# Patient Record
Sex: Female | Born: 1935 | Race: White | Hispanic: No | Marital: Married | Smoking: Never smoker
Health system: Southern US, Community
[De-identification: ages and names within clinical notes are randomized; demographics above are authoritative.]

## PROBLEM LIST (undated history)

## (undated) DIAGNOSIS — K5730 Diverticulosis of large intestine without perforation or abscess without bleeding: Secondary | ICD-10-CM

## (undated) DIAGNOSIS — T8859XA Other complications of anesthesia, initial encounter: Secondary | ICD-10-CM

## (undated) DIAGNOSIS — G4730 Sleep apnea, unspecified: Secondary | ICD-10-CM

## (undated) DIAGNOSIS — M1990 Unspecified osteoarthritis, unspecified site: Secondary | ICD-10-CM

## (undated) DIAGNOSIS — I4891 Unspecified atrial fibrillation: Secondary | ICD-10-CM

## (undated) DIAGNOSIS — K5792 Diverticulitis of intestine, part unspecified, without perforation or abscess without bleeding: Secondary | ICD-10-CM

## (undated) DIAGNOSIS — R7303 Prediabetes: Secondary | ICD-10-CM

## (undated) DIAGNOSIS — Z972 Presence of dental prosthetic device (complete) (partial): Secondary | ICD-10-CM

## (undated) DIAGNOSIS — J45909 Unspecified asthma, uncomplicated: Secondary | ICD-10-CM

## (undated) DIAGNOSIS — R42 Dizziness and giddiness: Secondary | ICD-10-CM

## (undated) DIAGNOSIS — I639 Cerebral infarction, unspecified: Secondary | ICD-10-CM

## (undated) DIAGNOSIS — T4145XA Adverse effect of unspecified anesthetic, initial encounter: Secondary | ICD-10-CM

## (undated) DIAGNOSIS — I10 Essential (primary) hypertension: Secondary | ICD-10-CM

## (undated) DIAGNOSIS — K219 Gastro-esophageal reflux disease without esophagitis: Secondary | ICD-10-CM

## (undated) HISTORY — DX: Essential (primary) hypertension: I10

## (undated) HISTORY — DX: Gastro-esophageal reflux disease without esophagitis: K21.9

## (undated) HISTORY — DX: Sleep apnea, unspecified: G47.30

## (undated) HISTORY — DX: Diverticulosis of large intestine without perforation or abscess without bleeding: K57.30

## (undated) HISTORY — DX: Diverticulitis of intestine, part unspecified, without perforation or abscess without bleeding: K57.92

## (undated) HISTORY — DX: Cerebral infarction, unspecified: I63.9

---

## 1978-11-05 HISTORY — PX: LAPAROSCOPIC SALPINGOOPHERECTOMY: SUR795

## 1978-11-05 HISTORY — PX: ABDOMINAL HYSTERECTOMY: SHX81

## 1996-11-05 HISTORY — PX: BLADDER SURGERY: SHX569

## 2004-08-23 ENCOUNTER — Ambulatory Visit: Payer: Self-pay | Admitting: Unknown Physician Specialty

## 2004-11-05 HISTORY — PX: COLON SURGERY: SHX602

## 2005-10-05 ENCOUNTER — Ambulatory Visit: Payer: Self-pay | Admitting: General Surgery

## 2005-10-16 ENCOUNTER — Ambulatory Visit: Payer: Self-pay | Admitting: General Surgery

## 2005-10-17 ENCOUNTER — Ambulatory Visit: Payer: Self-pay | Admitting: General Surgery

## 2005-10-31 ENCOUNTER — Other Ambulatory Visit: Payer: Self-pay

## 2005-10-31 ENCOUNTER — Ambulatory Visit: Payer: Self-pay | Admitting: General Surgery

## 2005-11-07 ENCOUNTER — Inpatient Hospital Stay: Payer: Self-pay | Admitting: General Surgery

## 2006-06-27 ENCOUNTER — Ambulatory Visit: Payer: Self-pay | Admitting: Internal Medicine

## 2007-06-01 IMAGING — CR DG ABDOMEN 2V
1 series · 3 of 3 positions shown · non-contrast
Comparison: none

REASON FOR EXAM: Abdominal pain and cramping
COMMENTS:

[Series 1: view not recorded · 0.17mm/px · 3 of 3 slices shown]
[im 1/3]
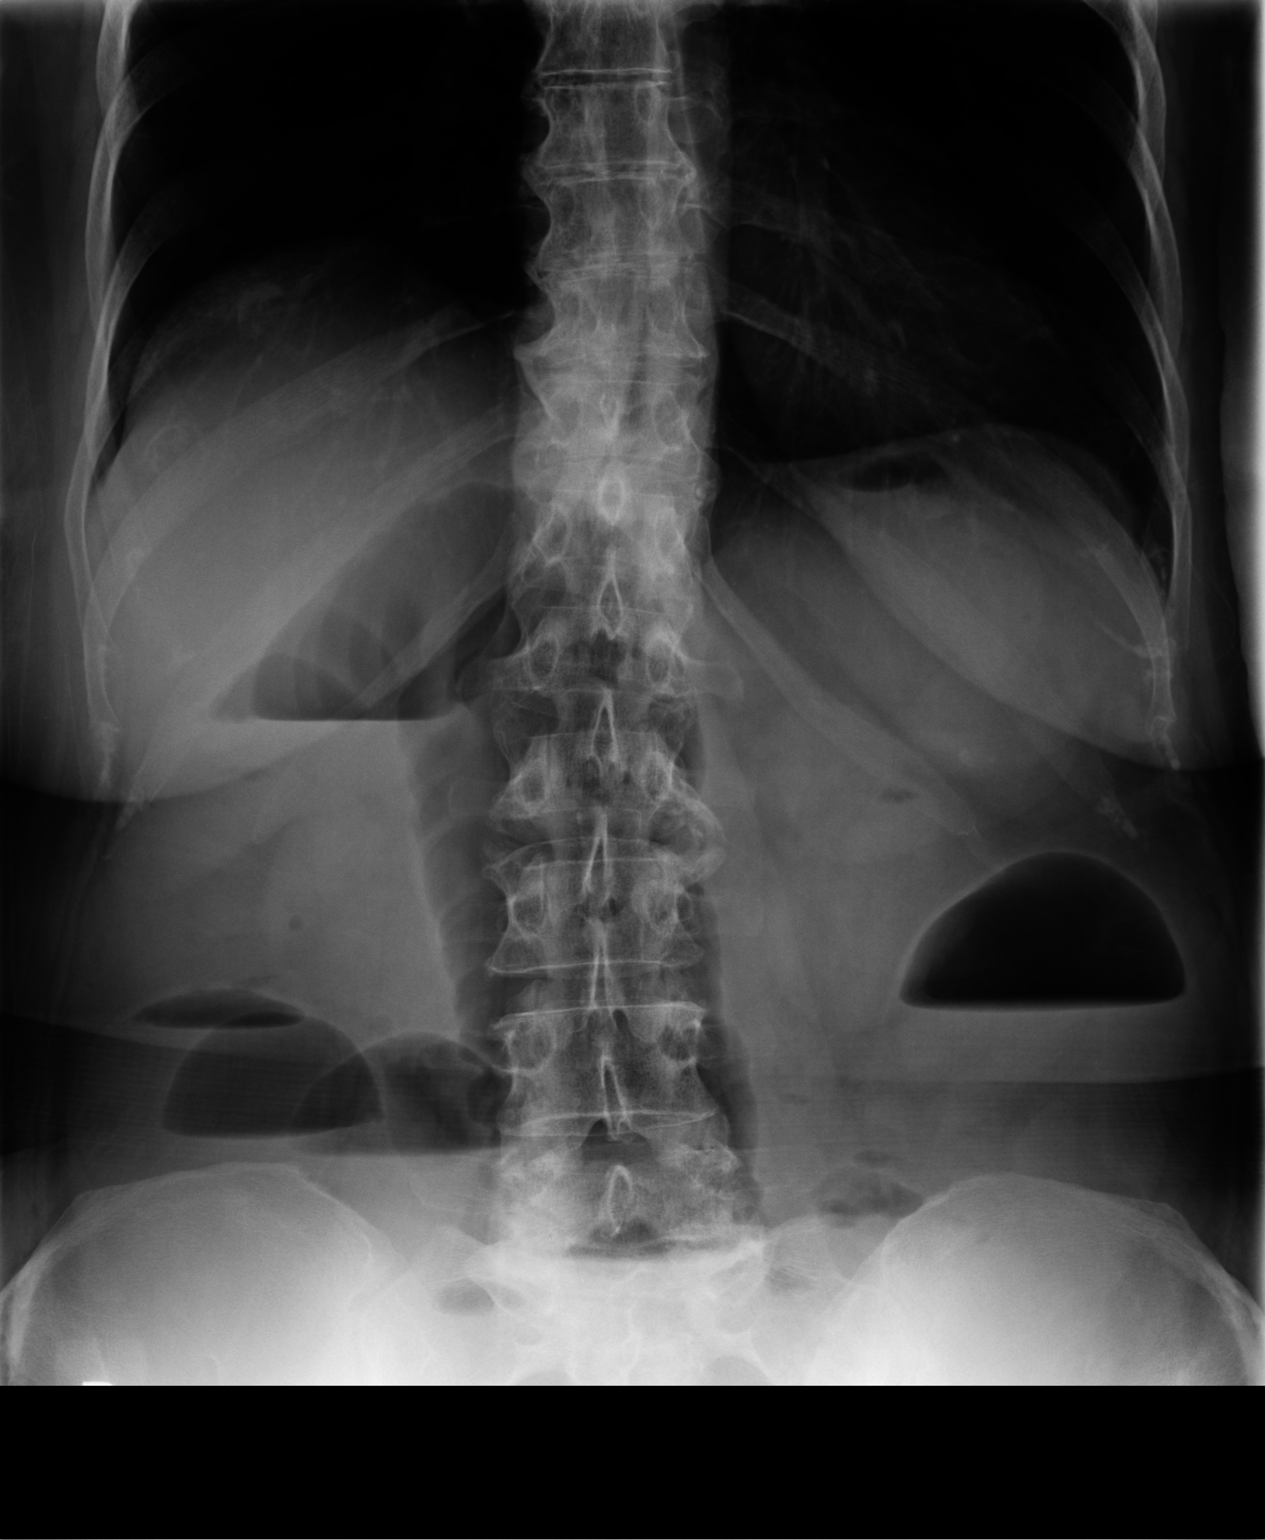
[im 2/3]
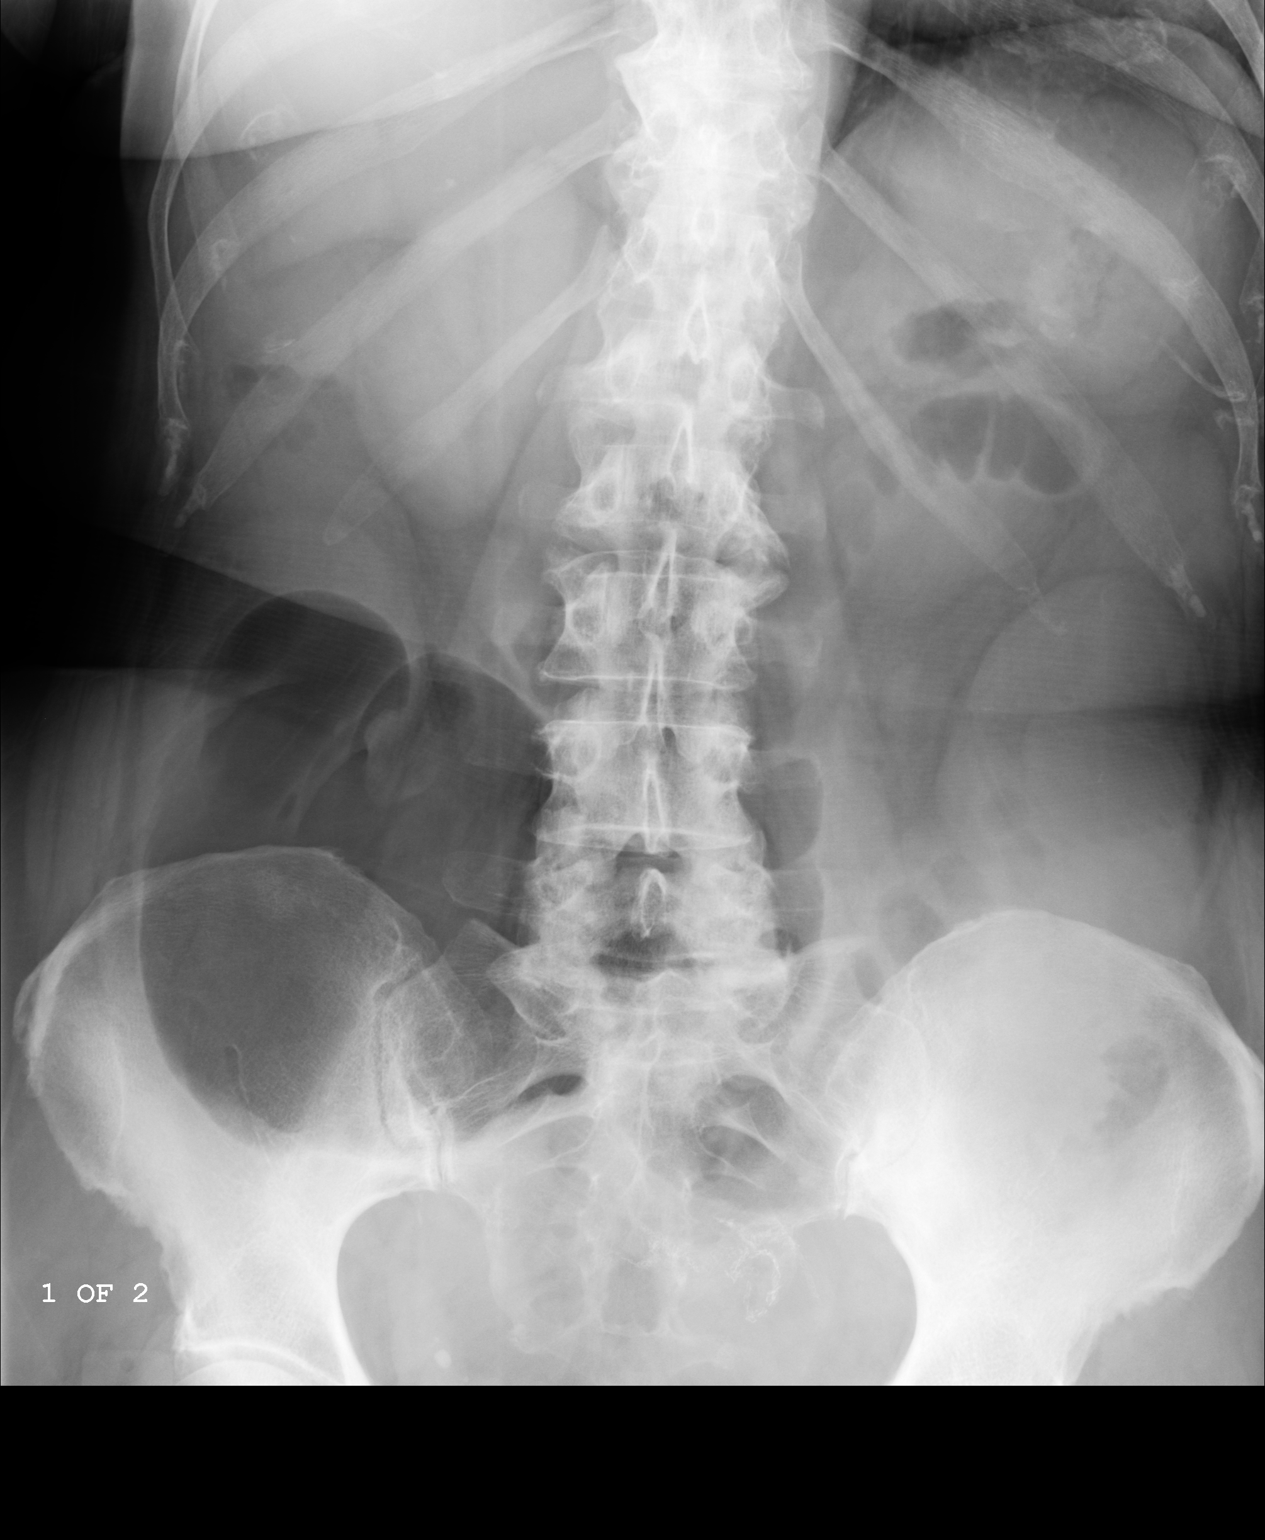
[im 3/3]
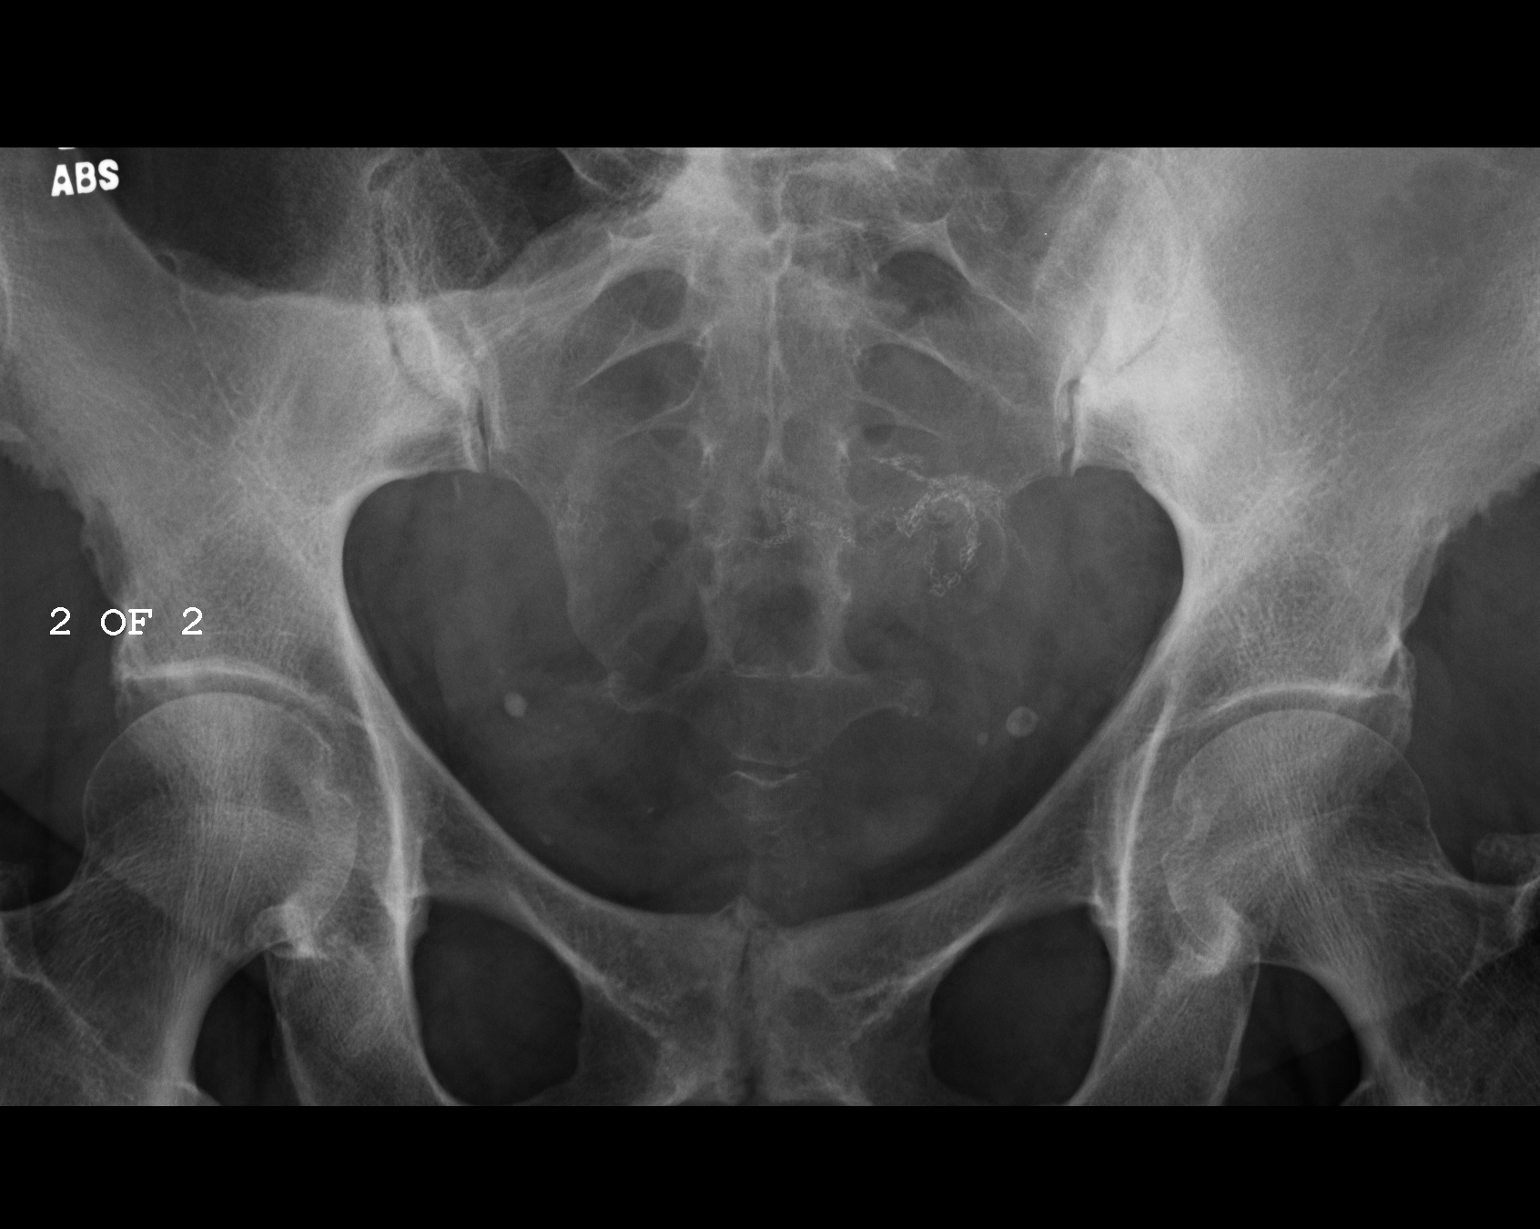

[3 of 3 positions shown; findings below may reference images not displayed]

PROCEDURE:     DXR - DXR ABDOMEN 2 V FLAT AND ERECT  - October 16, 2005  [DATE]

RESULT:     Flat and erect views of the abdomen show postoperative metallic
clips in the pelvis.  There are noted dilated loops of bowel containing
air-fluid levels.  The appearance is suspicious for bowel obstruction.
There is noted a pocket of gas in the RIGHT lower quadrant, which may
represent gas in the cecum but otherwise the colon appears to be relatively
airless.  Further evaluation by CT might be helpful.
IMPRESSION: Possible small bowel obstruction.

Cecal volvulus is considered unlikely but is also a consideration in the
differential.

Suggest CT for further evaluation.

## 2007-07-10 ENCOUNTER — Ambulatory Visit: Payer: Self-pay | Admitting: Internal Medicine

## 2007-11-15 ENCOUNTER — Ambulatory Visit: Payer: Self-pay | Admitting: Internal Medicine

## 2008-11-04 ENCOUNTER — Ambulatory Visit: Payer: Self-pay | Admitting: General Surgery

## 2008-11-05 HISTORY — PX: HERNIA REPAIR: SHX51

## 2008-11-18 ENCOUNTER — Inpatient Hospital Stay: Payer: Self-pay | Admitting: General Surgery

## 2009-05-02 ENCOUNTER — Ambulatory Visit: Payer: Self-pay | Admitting: Internal Medicine

## 2009-07-19 ENCOUNTER — Inpatient Hospital Stay: Payer: Self-pay | Admitting: Internal Medicine

## 2009-08-01 ENCOUNTER — Emergency Department: Payer: Self-pay | Admitting: Emergency Medicine

## 2009-11-05 ENCOUNTER — Ambulatory Visit: Payer: Self-pay

## 2009-11-05 HISTORY — PX: COLONOSCOPY: SHX174

## 2009-11-11 ENCOUNTER — Ambulatory Visit: Payer: Self-pay

## 2009-12-06 ENCOUNTER — Ambulatory Visit: Payer: Self-pay

## 2009-12-12 ENCOUNTER — Ambulatory Visit: Payer: Self-pay

## 2009-12-16 ENCOUNTER — Ambulatory Visit: Payer: Self-pay

## 2010-01-03 ENCOUNTER — Ambulatory Visit: Payer: Self-pay

## 2010-06-26 ENCOUNTER — Ambulatory Visit: Payer: Self-pay | Admitting: Internal Medicine

## 2010-09-19 ENCOUNTER — Ambulatory Visit: Payer: Self-pay | Admitting: General Surgery

## 2010-12-15 ENCOUNTER — Ambulatory Visit: Payer: Self-pay

## 2010-12-21 ENCOUNTER — Ambulatory Visit: Payer: Self-pay | Admitting: Internal Medicine

## 2011-01-04 ENCOUNTER — Ambulatory Visit: Payer: Self-pay | Admitting: Internal Medicine

## 2011-11-21 ENCOUNTER — Inpatient Hospital Stay: Payer: Self-pay | Admitting: Specialist

## 2011-11-21 LAB — BASIC METABOLIC PANEL
Anion Gap: 14 (ref 7–16)
BUN: 15 mg/dL (ref 7–18)
Co2: 25 mmol/L (ref 21–32)
Creatinine: 0.8 mg/dL (ref 0.60–1.30)
EGFR (African American): >60
Osmolality: 286 (ref 275–301)

## 2011-11-21 LAB — HEMOGLOBIN: HGB: 11 g/dL — ABNORMAL LOW (ref 12.0–16.0)

## 2011-11-21 LAB — CBC
MCH: 31.7 pg (ref 26.0–34.0)
MCHC: 33.8 g/dL (ref 32.0–36.0)
MCV: 94 fL (ref 80–100)
RBC: 4.07 10*6/uL (ref 3.80–5.20)
RDW: 13.1 % (ref 11.5–14.5)
WBC: 6.9 10*3/uL (ref 3.6–11.0)

## 2011-11-22 LAB — HEMATOCRIT: HCT: 32.3 % — ABNORMAL LOW (ref 35.0–47.0)

## 2011-11-23 LAB — CBC WITH DIFFERENTIAL/PLATELET
Basophil #: 0 10*3/uL (ref 0.0–0.1)
Basophil %: 0.5 %
Eosinophil %: 1.2 %
HCT: 28.4 % — ABNORMAL LOW (ref 35.0–47.0)
HGB: 9.5 g/dL — ABNORMAL LOW (ref 12.0–16.0)
Lymphocyte #: 1.9 10*3/uL (ref 1.0–3.6)
Lymphocyte %: 22.4 %
Monocyte %: 12.7 %
Neutrophil #: 5.5 10*3/uL (ref 1.4–6.5)
RBC: 3 10*6/uL — ABNORMAL LOW (ref 3.80–5.20)
RDW: 12.8 % (ref 11.5–14.5)
WBC: 8.6 10*3/uL (ref 3.6–11.0)

## 2011-11-23 LAB — BASIC METABOLIC PANEL
Anion Gap: 11 (ref 7–16)
BUN: 12 mg/dL (ref 7–18)
Calcium, Total: 7.8 mg/dL — ABNORMAL LOW (ref 8.5–10.1)
EGFR (African American): >60
EGFR (Non-African Amer.): 58 — ABNORMAL LOW
Glucose: 117 mg/dL — ABNORMAL HIGH (ref 65–99)
Osmolality: 288 (ref 275–301)
Sodium: 144 mmol/L (ref 136–145)

## 2011-11-25 LAB — BASIC METABOLIC PANEL
Calcium, Total: 8.3 mg/dL — ABNORMAL LOW (ref 8.5–10.1)
Chloride: 105 mmol/L (ref 98–107)
Glucose: 139 mg/dL — ABNORMAL HIGH (ref 65–99)
Osmolality: 284 (ref 275–301)
Potassium: 4.1 mmol/L (ref 3.5–5.1)
Sodium: 142 mmol/L (ref 136–145)

## 2011-11-25 LAB — CBC WITH DIFFERENTIAL/PLATELET
Basophil #: 0 10*3/uL (ref 0.0–0.1)
Eosinophil #: 0.2 10*3/uL (ref 0.0–0.7)
HCT: 27.4 % — ABNORMAL LOW (ref 35.0–47.0)
Lymphocyte %: 9.2 %
MCHC: 33.7 g/dL (ref 32.0–36.0)
Monocyte #: 0.6 10*3/uL (ref 0.0–0.7)
Monocyte %: 5.8 %
Neutrophil #: 8.4 10*3/uL — ABNORMAL HIGH (ref 1.4–6.5)
Platelet: 230 10*3/uL (ref 150–440)
RDW: 13 % (ref 11.5–14.5)
WBC: 10.1 10*3/uL (ref 3.6–11.0)

## 2011-11-26 LAB — URINALYSIS, COMPLETE
Nitrite: NEGATIVE
Ph: 7 (ref 4.5–8.0)
Protein: NEGATIVE
RBC,UR: <1 /HPF (ref 0–5)
WBC UR: 1 /HPF (ref 0–5)

## 2011-11-27 LAB — URINE CULTURE

## 2012-01-22 ENCOUNTER — Encounter: Payer: Self-pay | Admitting: Specialist

## 2012-02-04 ENCOUNTER — Encounter: Payer: Self-pay | Admitting: Specialist

## 2012-03-05 ENCOUNTER — Encounter: Payer: Self-pay | Admitting: Specialist

## 2012-11-14 ENCOUNTER — Ambulatory Visit: Payer: Self-pay | Admitting: Neurology

## 2012-11-14 LAB — CREATININE, SERUM: EGFR (African American): >60

## 2013-03-20 ENCOUNTER — Ambulatory Visit: Payer: Self-pay | Admitting: Internal Medicine

## 2013-05-21 ENCOUNTER — Encounter: Payer: Self-pay | Admitting: *Deleted

## 2013-09-27 ENCOUNTER — Ambulatory Visit: Payer: Self-pay | Admitting: Otolaryngology

## 2013-10-14 ENCOUNTER — Ambulatory Visit: Payer: Self-pay | Admitting: Otolaryngology

## 2015-02-27 NOTE — Consult Note (Signed)
Brief Consult Note: Diagnosis: preop medical clearance.   Patient was seen by consultant.   Consult note dictated.   Recommend to proceed with surgery or procedure.   Orders entered.   Discussed with Attending MD.   Comments: 1. preop medical clearance: medically clear, low risk for planned surgery. no acute cardio-pulmo dz.  2. hypertension: blood pressure stable, continue home meds.  3. hyperlipidemia: continue statin  4. asthma: stable. spirometry postop  5. prophylaxis: nexium and dvt prophylaxis per ortho, hold plavix for now, can resume postop soon possible.  Electronic Signatures: Patricia PesaShah, Jacquese Hackman S (MD)  (Signed 16-Jan-13 13:47)  Authored: Brief Consult Note   Last Updated: 16-Jan-13 13:47 by Patricia PesaShah, Patsye Sullivant S (MD)

## 2015-02-27 NOTE — Consult Note (Signed)
PATIENT NAME:  Lisa Reynolds, Lisa Reynolds MR#:  [id number] DATE OF BIRTH:  02-Jul-1935  DATE OF CONSULTATION:  11/21/2011  REFERRING PHYSICIAN:  Deeann Saint, MD CONSULTING PHYSICIAN:  Olyn Landstrom S. Sherryll Burger, MD  PRIMARY CARE PHYSICIAN: Einar Crow, MD  REASON FOR CONSULTATION: Preop medical clearance.  HISTORY OF PRESENT ILLNESS: The patient is a 80 year old female with known history of hypertension and hyperlipidemia who is being admitted for right tibia and fibula fracture status post fall and we are being consulted for preop medical clearance. The patient was walking her dog and slipped on a wet ramp. She started having deformity and swelling of her right lower leg. She was brought into the emergency department by EMS and her x-ray is showed spiral fracture of the tibia and tibiofibular articulation. The patient denies any chest pain, shortness of breath, or any other symptoms, except pain at her fracture site. While in triage her blood pressure was 206/107 which has normalized now. It is 128/78 mmHg. She is saturating 98% on room air.   PAST MEDICAL HISTORY:  1. Hypertension.  2. Hyperlipidemia.  3. History of transient ischemic attack.  4. Mild carotid stenosis.  5. Chronic allergic rhinitis.   ALLERGIES: Aspirin, codeine, Flagyl, morphine, sulfa, and tape.   HOME MEDICATIONS:  1. Amlodipine 10 mg p.o. daily.  2. Metoprolol 100 mg p.o. daily.  3. Nexium 40 mg p.o. daily.  4. Plavix 75 mg p.o. daily.  5. Pravastatin 80 mg p.o. at bedtime.   SOCIAL HISTORY: No smoking. No alcohol. She is married and has two children. She is retired.  FAMILY HISTORY: Mother died at the age of 66 due to CVA. Father died in his 55s due to an accident.   PAST SURGICAL HISTORY:  1. Hysterectomy.  2. Bladder suspension.  3. Multiple abdominal surgeries for colectomy.  4. Ventral hernia repair.   REVIEW OF SYSTEMS: CONSTITUTIONAL: No fever, fatigue, or weakness. Does have pain of the right leg, at the fracture site.  EYES: No blurred or double vision. ENT: No tinnitus or ear pain. RESPIRATORY: No cough, wheezing, or hemoptysis. CARDIOVASCULAR: No chest pain, orthopnea, or edema. GASTROINTESTINAL: No nausea, vomiting, or diarrhea. GU: No dysuria or hematuria. ENDOCRINE: No polyuria or nocturia. HEMATOLOGY: No anemia or easy bruising. SKIN: No rash or lesion. MUSCULOSKELETAL: Right tibia/fibula fracture and pain. NEUROLOGIC: History of transient ischemic attack. No tingling, numbness, or weakness. PSYCHIATRY: No history of anxiety or depression.   PHYSICAL EXAMINATION:   VITAL SIGNS: Temperature 95.6, heart rate 64 per minute, respirations 20 per minute, blood pressure 128/78 mmHg, and she is saturating 98% on room air.  GENERAL: The patient is a 80 year old female lying in bed comfortably without any acute distress.   EYES: Pupils are equal, round, and reactive to reactive to light and accommodation. No scleral icterus. Extraocular muscles are intact.   HEENT: Head atraumatic, normocephalic. Oropharynx and nasopharynx clear.   NECK: Supple. No jugular venous distention. No thyroid enlargement or tenderness.   LUNGS: Clear to auscultation bilaterally. No wheezing, rales, rhonchi, or crepitation.   HEART: S1 and S2 normal. No murmurs, rubs, or gallops.   ABDOMEN: Soft, nontender, and nondistended. Bowel sounds present. No organomegaly or mass.   EXTREMITIES: No pedal edema, cyanosis or clubbing. She does have swelling and deformity of her right lower extremity and has an Ace bandage wrapped all over her right lower extremity.   NEUROLOGIC: Nonfocal examination. Her right lower extremity was not moved due to recent fracture and a lot of pain  along with tenderness. Cranial nerves III through XII intact. Muscle strength 5/5 in all extremities, except right lower extremity. Sensation is intact.   PSYCH: The patient is oriented to time, place, and person x3.   SKIN: No obvious rash, lesion, or ulcer.    LABS/STUDIES: Normal BMP. Normal CBC.   Chest x-ray while in the emergency department showed no acute cardiopulmonary disease. Borderline mild cardiomegaly.   Right tibia and fibula x-ray on 11/21/2011 showed complex spiral fracture in the mid to distal third of the right tibia. Fracture is extending into the distal tibiofibular articulation.   Pelvic x-ray on 11/21/2011 showed no acute bony abnormality.   IMPRESSION AND PLAN:  1. Preop medical clearance: The patient is medically cleared for planned surgery. She will be  low risk. She does not have any acute cardiopulmonary disease. Her EKG shows normal sinus rhythm, no major ST-T changes. She has low voltage QRS pattern. We will continue beta blocker at this time along with statin. 2. Hypertension: We will continue Toprol and amlodipine. Her blood pressure is stable.  3. Hyperlipidemia: We will continue Pravastatin. 4. Asthma: Stable for now. We will start her on albuterol as needed and order incentive spirometry postoperatively.  5. GI prophylaxis: We will start her on Nexium.  6. DVT prophylaxis: Per orthopedics. We will hold Plavix for now and can be resumed postoperatively as soon as possible.       CODE STATUS: FULL CODE.   TOTAL TIME TAKING CARE OF PATIENT: 55 minutes.   ____________________________ Ellamae SiaVipul S. Sherryll BurgerShah, MD vss:slb D: 11/21/2011 13:55:53 ET T: 11/21/2011 14:12:18 ET JOB#: [id number]  cc: Natalee Tomkiewicz S. Sherryll BurgerShah, MD, <Dictator> Marya AmslerMarshall W. Dareen PianoAnderson, MD Valinda HoarHoward E. Miller, MD Ellamae SiaVIPUL S Lexa Digestive CareHAH MD ELECTRONICALLY SIGNED 11/22/2011 10:59

## 2015-02-27 NOTE — Discharge Summary (Signed)
PATIENT NAME:  Lisa PetersDOBY, Genevia B MR#:  [id number] DATE OF BIRTH:  02-04-1935  DATE OF ADMISSION:  11/21/2011 DATE OF DISCHARGE:  11/26/2011  DISCHARGE DIAGNOSES:  1. Displaced spiral right tibial shaft fracture. 2. Hypertension.  3. Hyperlipidemia.  4. History of transient ischemic attack. 5. Mild carotid stenosis.  6. Chronic allergic rhinitis.  ALLERGIES: Aspirin, codeine, Flagyl, morphine, sulfa, and tape.   PROCEDURE: On 11/21/2011 right tibial rodding with locking screws proximally and distally.   COMPLICATIONS: None.   CONSULTANT: [facility].  DISCHARGE/HOME MEDICATIONS: 1. Amlodipine 10 mg daily.  2. Metoprolol 100 mg daily.  3. Nexium 40 mg daily. 4. Plavix 75 mg daily.  5. Pravastatin 80 mg at bedtime.  6. Tramadol 50 mg every 6 hours p.r.n. pain.  7. Enteric-coated aspirin one p.o. twice a day. 8. Iron 325 mg daily.   HISTORY OF PRESENT ILLNESS: The patient is a 80 year old female who tripped and fell while walking her dog on the morning of admission. She was unable to ambulate and was brought to the emergency room where exam and x-rays revealed a spiral displaced right tibial fracture. She was admitted for surgical fixation after discussion of treatment with the patient and her daughter. She was seen by the [facility] medical service and cleared for surgery. Risks and benefits were discussed with them.   PAST MEDICAL HISTORY: As above including skin cancer and vertigo.  PAST SURGICAL HISTORY:  1. Cataracts. 2. Diverticulitis. 3. Hernia surgery.  4. Hysterectomy.   ALLERGIES: As above.   FAMILY HISTORY: Unremarkable.   SOCIAL HISTORY: The patient does not smoke or drink. She lives at home.  REVIEW OF SYSTEMS: Unremarkable.   PHYSICAL EXAMINATION: The patient was alert and cooperative. She had pain and swelling over the right lower tibia. There was some rotational deformity. Neurovascular status was good distally. There was a small scrape over the skin, but no  perforation of the skin.   LABORATORY DATA: Laboratory data on admission was satisfactory.   HOSPITAL COURSE: On 11/21/2011, the patient underwent tibial rodding of the right tibia. Postoperatively she did well. She had moderate pain and swelling. Her hemoglobin was 9.2 on the fourth postoperative day. She was gradually ambulated. She is ready for skilled nursing discharge. A short leg cast was applied to the leg. Wounds were benign. She is to be touch down weight bearing only on the leg. She will return to my office in two weeks for exam and x-ray, out of the cast. ____________________________ Valinda HoarHoward E. Odell Choung, MD hem:slb D: 11/26/2011 15:04:18 ET     T: 11/26/2011 15:50:32 ET       JOB#: [id number] cc: Valinda HoarHoward E. Kameria Canizares, MD, <Dictator> Marya AmslerMarshall W. Dareen PianoAnderson, MD Valinda HoarHOWARD E Makesha Belitz MD ELECTRONICALLY SIGNED 11/28/2011 7:58

## 2015-02-27 NOTE — Op Note (Signed)
PATIENT NAME:  Lisa PetersDOBY, Lisa Reynolds MR#:  [id number] DATE OF BIRTH:  1935-03-15  DATE OF PROCEDURE:  11/21/2011  PREOPERATIVE DIAGNOSIS: Spiral unstable fracture right tibia, distal third, with proximal fibular fracture.   POSTOPERATIVE DIAGNOSIS: Spiral unstable fracture right tibia, distal third, with proximal fibular fracture.   PROCEDURE PERFORMED: Right tibial nailing with a Depuy Versa nail (30-mm x 11-mm nail, locking screws proximally and distally.)   SURGEON: Valinda HoarHoward E. Ebbie Sorenson, M.D.   ANESTHESIA: General LMA.   COMPLICATIONS: None.   DRAINS: None.   ESTIMATED BLOOD LOSS: 200 mL.   REPLACEMENT: None.   DESCRIPTION OF PROCEDURE: The patient was brought to the operating room where she underwent satisfactory general LMA anesthesia in the supine position. The right leg was prepped and draped in sterile fashion. The tourniquet was not used. The leg was placed on a tibial distractor and the foot stabilized with a sterile wrap.  Alignment appeared to be excellent. Fluoroscopy showed the fracture to be reduced virtually anatomically. A longitudinal incision was made medial to the patellar tendon and dissection carried out sharply through subcutaneous tissue. The fascia was incised and the patellar tendon retracted laterally. The guidepin was inserted and examined under fluoroscopy to ascertain position. When this was in proper position, enlarging drill hole was made in the proximal tibia. A guidepin was passed easily down to the ankle region. The guidepin measured just over 30 mm in length. The proximal tibia including the isthmus was reamed to 12.5 mm. There were some fracture lines distally so we did not drill past the fracture site. The 30 mm x 11 mm Versa nail tibial rod was chosen and was inserted easily into excellent position and the length appeared to be excellent. The proximal aiming device was angled medially and the distal screw hole was drilled and filled with a 45-mm screw. Another stab wound  was made distally and medially and the more proximal of the two distal screw holes was filled with a 38-mm screw. Fluoroscopy showed the rod and fracture to be in excellent position. All wounds were irrigated and the fascia was closed with 2-0 Vicryl. The subcutaneous tissue was closed with 2-0 Vicryl and all skin wounds were closed with staples. Dry sterile dressings and a posterior splint were applied. The patient was awakened and transferred to her hospital bed and taken to recovery in good condition. Alignment appeared to be excellent.    ____________________________ Valinda HoarHoward E. Marielle Mantione, MD hem:bjt D: 11/21/2011 20:53:18 ET T: 11/22/2011 10:09:25 ET JOB#: [id number]  cc: Valinda HoarHoward E. Ascher Schroepfer, MD, <Dictator> Valinda HoarHOWARD E Derrik Mceachern MD ELECTRONICALLY SIGNED 11/22/2011 12:10

## 2015-02-27 NOTE — H&P (Signed)
Subjective/Chief Complaint Pain right leg    History of Present Illness 80 year old female tripped and fell this am while walking her dog.  Unable to ambulate and was brought to Emergency Room where exam and X-rays show a spiral, displaced right tibial fracture.  Unstable pattern.  circulation/sensation/motor function good.  Recommended surgical rodding to patient and daughter who agree with plan.  Risks and benefits of surgery were discussed at length including but not limited to infection, non union, nerve or blood vessed damage, non union, need for repeat surgery, blood clots and lung emboli, and death.  Plan to proceed later today as she had bee cleared surgically.    Primary Physician Bethann Punches   Past Med/Surgical Hx:  skin cancer:   Vertigo:   Arthritis:   gerd:   cataracts:   tia x3:   abd surgery for diverticulitis:   Hypertension:   hernia surgery:   hysterectomy:   ALLERGIES:  Codeine: N/V  Flagyl: N/V  ASA: GI Distress  Sulfa drugs: GI Distress  Tape: Other  Morphine: Anxiety, GI Distress  HOME MEDICATIONS:  amlodipine 10 mg oral tablet: 1 tab(s) orally once a day, Active  Metoprolol Succinate ER 100 mg oral tablet, extended release: 1 tab(s) orally once a day, Active  Nexium 40 mg oral delayed release capsule: 1 caps orally once a day, Active  Plavix 75 mg oral tablet: 1 tab(s) orally once a day, Active  pravastatin 80 mg oral tablet: 1 tab(s) orally once a day (at bedtime), Active  Family and Social History:   Family History Non-Contributory    Social History negative tobacco    Place of Living Home   Review of Systems:   Fever/Chills No    Cough No    Sputum No    Abdominal Pain No   Physical Exam:   GEN WD, WN    HEENT pink conjunctivae    NECK supple    RESP normal resp effort    CARD regular rate    ABD denies tenderness    GU foley catheter in place    LYMPH negative neck    EXTR negative edema    SKIN normal to palpation,  small skin tag over tibia.    NEURO motor/sensory function intact    PSYCH alert, A+O to time, place, person   Routine Chem:  16-Jan-13 10:07    Glucose, Serum 107   BUN 15   Creatinine (comp) 0.80   Sodium, Serum 143   Potassium, Serum 4.2   Chloride, Serum 104   CO2, Serum 25   Calcium (Total), Serum 8.8   Anion Gap 14   Osmolality (calc) 286   eGFR (African American) >60   eGFR (Non-African American) >60  Routine Hem:  16-Jan-13 10:07    WBC (CBC) 6.9   RBC (CBC) 4.07   Hemoglobin (CBC) 12.9   Hematocrit (CBC) 38.2   Platelet Count (CBC) 227   MCV 94   MCH 31.7   MCHC 33.8   RDW 13.1   Radiology Results: XRay:    16-Jan-13 11:05, Pelvis AP Only   Pelvis AP Only   REASON FOR EXAM:    fall pain  COMMENTS:       PROCEDURE: DXR - DXR PELVIS AP ONLY  - Nov 21 2011 11:05AM     RESULT: The bony structures appear to be within normal limits. No bony   destruction or fracture is seen. Phleboliths are seen in the pelvis. The  bowel gas pattern is unremarkable.    IMPRESSION:   1. No acute bony abnormality of the pelvis evident.    Thank you for the opportunity to contribute to the care of your patient.         Verified By: Sundra Aland, M.D., MD    16-Jan-13 11:05, Tibia And Fibula Right   Tibia And Fibula Right   REASON FOR EXAM:    fall injury  COMMENTS:       PROCEDURE: DXR - DXR TIBIA AND FIBULA RT (LOWER L  - Nov 21 2011 11:05AM     RESULT: Images of the right lower extremity demonstrate a spiral fracture   in the distal third of the right tibia with slight posterior   displacement. Nondisplaced proximal fibular fracture is seen on the AP   view and lateral view.    IMPRESSION:  Complex spiral fracture in the mid to distal third of the   right tibia. The fracture appears to extend into the distal tibiofibular   articulation. Definite articular involvement at the ankle joint is not   identified on these images but should be investigated on  follow-up images   especially at ORIF if performed.  Thank you for the opportunity to contribute to the care of your patient.           Verified By: Sundra Aland, M.D., MD     Assessment/Admission Diagnosis Displaced right tibial fracture    Plan Right tibial rodding   Electronic Signatures: Park Breed (MD)  (Signed 16-Jan-13 14:10)  Authored: CHIEF COMPLAINT and HISTORY, PAST MEDICAL/SURGIAL HISTORY, ALLERGIES, HOME MEDICATIONS, FAMILY AND SOCIAL HISTORY, REVIEW OF SYSTEMS, PHYSICAL EXAM, LABS, Radiology, ASSESSMENT AND PLAN   Last Updated: 16-Jan-13 14:10 by Park Breed (MD)

## 2015-08-08 ENCOUNTER — Other Ambulatory Visit: Payer: Self-pay | Admitting: Neurology

## 2015-08-08 DIAGNOSIS — E236 Other disorders of pituitary gland: Secondary | ICD-10-CM

## 2015-08-23 ENCOUNTER — Ambulatory Visit
Admission: RE | Admit: 2015-08-23 | Discharge: 2015-08-23 | Disposition: A | Discharge status: Home / Self Care | Payer: Medicare Other | Source: Ambulatory Visit | Attending: Neurology | Admitting: Neurology

## 2015-08-23 DIAGNOSIS — E236 Other disorders of pituitary gland: Secondary | ICD-10-CM | POA: Insufficient documentation

## 2015-08-23 MED ORDER — GADOBENATE DIMEGLUMINE 529 MG/ML IV SOLN
10.0000 mL | Freq: Once | INTRAVENOUS | Status: AC | PRN
  Administered 2015-08-23: 10 mL via INTRAVENOUS

## 2015-08-24 ENCOUNTER — Other Ambulatory Visit: Payer: Self-pay | Admitting: Neurology

## 2015-08-24 DIAGNOSIS — E236 Other disorders of pituitary gland: Secondary | ICD-10-CM

## 2016-01-16 ENCOUNTER — Encounter: Payer: Self-pay | Admitting: *Deleted

## 2016-01-31 ENCOUNTER — Encounter: Payer: Self-pay | Admitting: General Surgery

## 2016-02-01 ENCOUNTER — Encounter: Payer: Self-pay | Admitting: General Surgery

## 2016-02-01 ENCOUNTER — Ambulatory Visit (INDEPENDENT_AMBULATORY_CARE_PROVIDER_SITE_OTHER): Payer: Medicare Other | Admitting: General Surgery

## 2016-02-01 VITALS — BP 144/72 | HR 74 | Resp 14 | Ht 62.0 in | Wt 209.0 lb

## 2016-02-01 DIAGNOSIS — H8111 Benign paroxysmal vertigo, right ear: Secondary | ICD-10-CM | POA: Diagnosis not present

## 2016-02-01 DIAGNOSIS — R1310 Dysphagia, unspecified: Secondary | ICD-10-CM | POA: Diagnosis not present

## 2016-02-01 MED ORDER — MECLIZINE HCL 32 MG PO TABS: ORAL_TABLET | ORAL | Status: AC

## 2016-02-01 NOTE — Progress Notes (Signed)
Patient ID: Lisa PetersMary B Boeve, female   DOB: [birth year], 81 y.o.   MRN: [id number]  Chief Complaint  Patient presents with  . Other    Upper GI problems    HPI Lisa Reynolds is a 81 y.o. female here today for upper GI problems. Patient states she is having trouble swallowing about 6 month. She states she can't take her larger pills very well.  She has a sense as if they are sticking at the thoracic inlet. Occasionally meats or breads will transiently give her a sensation in the same area, but no episodes have required regurgitation of food. No difficulty with liquids.  Patient states in the last two weeks she has been having vertigo.This has occurred on 2 occasions, one one getting up bed , and the second by report awakened her from sleep. She described as objects moving horizontally in a back-and-forth fashion associated with flashing colors. Dizziness  Has occurred during both episodes.   The patient was hospitalized in September 2010 with this tibial light as versus labyrinthitis. During that visit she showed no improvement with meclizine and minimal improvement with diazepam. The patient reports that she has not taken any medications during these episodes.   She reports that getting up and moving around seems to help them resolve more quickly.  Nausea and vomiting has not accompanied these episodes at it did during her 2010 admission.    I personally reviewed the patient's history.      HPI  Past Medical History  Diagnosis Date  . Hypertension   . Stroke (HCC)   . Diverticulitis   . Diverticulosis of colon (without mention of hemorrhage)   . GERD (gastroesophageal reflux disease)   . Sleep apnea     C-pap    Past Surgical History  Procedure Laterality Date  . Abdominal hysterectomy  1980  . Colonoscopy  2011  . Bladder surgery  1998  . Laparoscopic salpingoopherectomy  1980  . Colon surgery  2006  . Hernia repair  2010    [facility]    Family History  Problem Relation Age of Onset   . Colon cancer Sister     2656    Social History Social History  Substance Use Topics  . Smoking status: Never Smoker   . Smokeless tobacco: Never Used  . Alcohol Use: No    Allergies  Allergen Reactions  . Codeine Nausea And Vomiting  . Flagyl [Metronidazole]   . Morphine And Related Diarrhea  . Sulfa Antibiotics   . Tape Itching    Current Outpatient Prescriptions  Medication Sig Dispense Refill  . ALPRAZolam (XANAX) 0.5 MG tablet TAKE 1/2 TABLET BY MOUTH EVERY DAY AS NEEDED FOR ANXIETY  0  . amLODipine (NORVASC) 10 MG tablet Take by mouth.    Marland Kitchen. aspirin EC 81 MG tablet Take 81 mg by mouth daily.    Marland Kitchen. esomeprazole (NEXIUM) 20 MG capsule Take 20 mg by mouth daily at 12 noon.    . loratadine (CLARITIN) 10 MG tablet Take 10 mg by mouth daily.    . metoprolol succinate (TOPROL-XL) 100 MG 24 hr tablet Take 100 mg by mouth daily. Take with or immediately following a meal.    . pravastatin (PRAVACHOL) 80 MG tablet Take by mouth.    . meclizine (ANTIVERT) 32 MG tablet One half tablet every 8 hours if needed for dizziness. 30 tablet 0   No current facility-administered medications for this visit.    Review of Systems Review of Systems  Constitutional: Negative.   Eyes: Negative.   Respiratory: Negative.   Cardiovascular: Negative.   Endocrine: Negative.   Genitourinary: Negative.   Neurological: Positive for dizziness.  Hematological: Negative.   Psychiatric/Behavioral: Negative.     Blood pressure 144/72, pulse 74, resp. rate 14, height  (1.575 m), weight 209 lb (94.802 kg).   The patient's weight is up 9 pounds from her 06/11/2011 exam. She was seen at that time for postprandial pain.    Physical Exam Physical Exam  Constitutional: She is oriented to person, place, and time. She appears well-developed and well-nourished.  HENT:  Mouth/Throat: Oropharynx is clear and moist.  Eyes: Conjunctivae are normal. Pupils are equal, round, and reactive to light. No  scleral icterus. Right pupil is reactive. Left pupil is reactive. Pupils are equal.  Neck: Neck supple.  Cardiovascular: Normal rate, regular rhythm and normal heart sounds.   Pulses:      Carotid pulses are 2+ on the right side, and 2+ on the left side.      Femoral pulses are 2+ on the right side, and 2+ on the left side.      Dorsalis pedis pulses are 2+ on the right side, and 2+ on the left side.  No edema.  Pulmonary/Chest: Effort normal and breath sounds normal.  Abdominal: Soft. Normal appearance and bowel sounds are normal. There is no hepatomegaly. There is no tenderness. No hernia.  Lymphadenopathy:    She has no cervical adenopathy.  Neurological: She is alert and oriented to person, place, and time. She has normal strength. No cranial nerve deficit. Coordination and gait normal.   No nystagmus evident on exam.    Cranial nerves II through XII intact.  Skin: Skin is warm and dry.    Data Reviewed September 2010 hospital records.    mark 25, 2000 for upper endoscopy for abdominal pain   2011 colonoscopy showed a lipoma in the cecum. Biopsy confirmed a submucosal lipoma. A diverticulum related to her previous colorectostomy was noted.  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   Vertigo     Plan         Patient to have a barium swallow completed. [facility] is near the patient's home, and she desired to have the study completed at that institution. She will notify the office the day the study is completed so the images can be reviewed.  Antivert provided if patient has recurrent episodes of dizziness.   PCP:  Sula Rumple This information has been scribed by Ples Specter CMA.    Earline Mayotte 02/02/2016, 1:50 PM

## 2016-02-01 NOTE — Patient Instructions (Signed)
Follow up appointment to be announced.  

## 2016-02-02 ENCOUNTER — Telehealth: Payer: Self-pay | Admitting: *Deleted

## 2016-02-02 ENCOUNTER — Encounter: Payer: Self-pay | Admitting: General Surgery

## 2016-02-02 DIAGNOSIS — H8110 Benign paroxysmal vertigo, unspecified ear: Secondary | ICD-10-CM | POA: Insufficient documentation

## 2016-02-02 DIAGNOSIS — R1310 Dysphagia, unspecified: Secondary | ICD-10-CM | POA: Insufficient documentation

## 2016-02-02 NOTE — Telephone Encounter (Signed)
Patient called the office to report that [facility] is [facility]09-16 at 10:45 am.   We have requested that a copy of the report be faxed to our office once complete.

## 2016-02-10 ENCOUNTER — Encounter: Payer: Self-pay | Admitting: General Surgery

## 2016-02-16 ENCOUNTER — Telehealth: Payer: Self-pay

## 2016-02-16 NOTE — Telephone Encounter (Signed)
-----   Message from Earline MayotteJeffrey W Byrnett, MD sent at 02/15/2016  5:09 PM EDT ----- Please notify the patient that I reviewed her area swallow. No blockage. No need for endoscopy or stretching. The muscle was just getting a little old and moves more slowly than in the past.  ----- Message -----    From: Erich MontaneEmily C Harris, CMA    Sent: 02/10/2016  11:39 AM      To: Earline MayotteJeffrey W Byrnett, MD

## 2016-02-16 NOTE — Telephone Encounter (Signed)
Notified patient as instructed, patient pleased. Discussed follow-up if needed, patient agrees.  

## 2016-07-02 ENCOUNTER — Encounter: Payer: Self-pay | Admitting: Emergency Medicine

## 2016-07-02 ENCOUNTER — Emergency Department
Admission: EM | Admit: 2016-07-02 | Discharge: 2016-07-02 | Disposition: A | Discharge status: Home / Self Care | Payer: Medicare Other | Attending: Emergency Medicine | Admitting: Emergency Medicine

## 2016-07-02 DIAGNOSIS — Z7982 Long term (current) use of aspirin: Secondary | ICD-10-CM | POA: Diagnosis not present

## 2016-07-02 DIAGNOSIS — R55 Syncope and collapse: Secondary | ICD-10-CM | POA: Diagnosis not present

## 2016-07-02 DIAGNOSIS — I10 Essential (primary) hypertension: Secondary | ICD-10-CM | POA: Diagnosis not present

## 2016-07-02 DIAGNOSIS — Z5321 Procedure and treatment not carried out due to patient leaving prior to being seen by health care provider: Secondary | ICD-10-CM | POA: Insufficient documentation

## 2016-07-02 LAB — BASIC METABOLIC PANEL
Anion gap: 9 (ref 5–15)
BUN: 26 mg/dL — ABNORMAL HIGH (ref 6–20)
CALCIUM: 9.4 mg/dL (ref 8.9–10.3)
CHLORIDE: 104 mmol/L (ref 101–111)
CO2: 23 mmol/L (ref 22–32)
CREATININE: 0.86 mg/dL (ref 0.44–1.00)
GFR calc Af Amer: >60 mL/min (ref >60)
GFR calc non Af Amer: >60 mL/min (ref >60)
Glucose, Bld: 91 mg/dL (ref 65–99)
Potassium: 3.7 mmol/L (ref 3.5–5.1)
Sodium: 136 mmol/L (ref 135–145)

## 2016-07-02 LAB — CBC
HCT: 41.9 % (ref 35.0–47.0)
Hemoglobin: 14.4 g/dL (ref 12.0–16.0)
MCH: 32.2 pg (ref 26.0–34.0)
MCHC: 34.4 g/dL (ref 32.0–36.0)
MCV: 93.7 fL (ref 80.0–100.0)
PLATELETS: 226 10*3/uL (ref 150–440)
RBC: 4.48 MIL/uL (ref 3.80–5.20)
RDW: 13.1 % (ref 11.5–14.5)
WBC: 8.4 10*3/uL (ref 3.6–11.0)

## 2016-07-02 NOTE — ED Triage Notes (Signed)
C/O feeling dizzy and feeling like she is about to pass out.  States symptoms started today at approximately 1330.  States she has been getting over shingles and recently her BP has been elevated.

## 2017-03-19 ENCOUNTER — Ambulatory Visit (INDEPENDENT_AMBULATORY_CARE_PROVIDER_SITE_OTHER): Payer: Medicare Other | Admitting: Vascular Surgery

## 2017-03-19 ENCOUNTER — Encounter (INDEPENDENT_AMBULATORY_CARE_PROVIDER_SITE_OTHER): Payer: Self-pay | Admitting: Vascular Surgery

## 2017-03-19 VITALS — BP 142/77 | HR 57 | Resp 15 | Ht 63.0 in | Wt 206.0 lb

## 2017-03-19 DIAGNOSIS — E785 Hyperlipidemia, unspecified: Secondary | ICD-10-CM

## 2017-03-19 DIAGNOSIS — M79604 Pain in right leg: Secondary | ICD-10-CM

## 2017-03-19 DIAGNOSIS — I10 Essential (primary) hypertension: Secondary | ICD-10-CM | POA: Insufficient documentation

## 2017-03-19 DIAGNOSIS — M79605 Pain in left leg: Secondary | ICD-10-CM | POA: Diagnosis not present

## 2017-03-19 NOTE — Progress Notes (Signed)
Subjective:    Patient ID: Lisa Reynolds, female    DOB: 28-Jun-1935, 82 y.o.   MRN: [id number] Chief Complaint  Patient presents with  . New Evaluation    Leg/Foot Pain   Patient presents as a new patient referred by Dr. Audelia Acton for bilateral lower extremity pain. The patient endorses a history of progressively worsening bilateral lower extremity pain. Of note, the patient broke her right leg "two times" and her right ankle "once". Patient states issues with walking - states she "hobbles". Bilateral lower extremity "cramping" from hips to knee with walking. Patient endorses multiple "burst veins" bilaterally.    Review of Systems  Constitutional: Negative.   HENT: Negative.   Eyes: Negative.   Respiratory: Negative.   Cardiovascular:       Bilateral Lower Extremity Pain  Gastrointestinal: Negative.   Endocrine: Negative.   Genitourinary: Negative.   Musculoskeletal: Negative.   Skin: Negative.   Allergic/Immunologic: Negative.   Neurological: Negative.   Hematological: Negative.   Psychiatric/Behavioral: Negative.       Objective:   Physical Exam  Constitutional: She is oriented to person, place, and time. She appears well-developed and well-nourished. No distress.  HENT:  Head: Normocephalic and atraumatic.  Eyes: Conjunctivae are normal. Pupils are equal, round, and reactive to light.  Neck: Normal range of motion.  Cardiovascular: Normal rate, regular rhythm and normal heart sounds.   Pulses:      Radial pulses are 2+ on the right side, and 2+ on the left side.  Hard to appreciate pedal pulses  Pulmonary/Chest: Effort normal.  Musculoskeletal: Normal range of motion. She exhibits edema.  Mild bilateral edema  Neurological: She is alert and oriented to person, place, and time.  Skin: Skin is warm and dry. She is not diaphoretic.  Scattered <1cm and >1cm varicose veins located bilaterally  Psychiatric: She has a normal mood and affect. Her behavior is normal. Judgment  and thought content normal.  Vitals reviewed.  BP (!) 142/77 (BP Location: Right Arm)   Pulse (!) 57   Resp 15   Ht 5\' 3"  (1.6 m)   Wt 206 lb (93.4 kg)   BMI 36.49 kg/m   Past Medical History:  Diagnosis Date  . Diverticulitis   . Diverticulosis of colon (without mention of hemorrhage)   . GERD (gastroesophageal reflux disease)   . Hypertension   . Sleep apnea    C-pap  . Stroke [facility]    Social History   Social History  . Marital status: Married    Spouse name: N/A  . Number of children: N/A  . Years of education: N/A   Occupational History  . Not on file.   Social History Main Topics  . Smoking status: Never Smoker  . Smokeless tobacco: Never Used  . Alcohol use No  . Drug use: No  . Sexual activity: Not on file   Other Topics Concern  . Not on file   Social History Narrative  . No narrative on file   Past Surgical History:  Procedure Laterality Date  . ABDOMINAL HYSTERECTOMY  1980  . BLADDER SURGERY  1998  . COLON SURGERY  2006  . COLONOSCOPY  2011    submucosal lipoma of the descending colon  . HERNIA REPAIR  2010   VENTRAL  . LAPAROSCOPIC SALPINGOOPHERECTOMY  1980   Family History  Problem Relation Age of Onset  . Colon cancer Sister        29   Allergies  Allergen  Reactions  . Aspirin Nausea Only  . Codeine Nausea And Vomiting  . Flagyl [Metronidazole]   . Morphine And Related Diarrhea  . Sulfa Antibiotics   . Tape Itching      Assessment & Plan:  Patient presents as a new patient referred by Dr. Audelia Actonheis for bilateral lower extremity pain. The patient endorses a history of progressively worsening bilateral lower extremity pain. Of note, the patient broke her right leg "two times" and her right ankle "once". Patient states issues with walking - states she "hobbles". Bilateral lower extremity "cramping" from hips to knee with walking. Patient endorses multiple "burst veins" bilaterally.   1. Lower extremity pain, bilateral - New Mixture of  arterial and venous symptoms. Will order ABI to rule out PAD Will order venous duplex to rule out reflux The patient was encouraged to wear graduated compression stockings (20-30 mmHg) on a daily basis. The patient was instructed to begin wearing the stockings first thing in the morning and removing them in the evening. The patient was instructed specifically not to sleep in the stockings. Prescription given.  In addition, behavioral modification including elevation during the day will be initiated. Anti-inflammatories for pain.  - VAS US ABI WITH/WO TBI; Future - VAS US LOWER EXTREMITY VENOUS REFLUX; Future  2. Hyperlipidemia, unspecified hyperlipidemia type - Stable Encouraged good control as its slows the progression of atherosclerotic disease  3. Essential hypertension - Stable Encouraged good control as its slows the progression of atherosclerotic disease  Current Outpatient Prescriptions on File Prior to Visit  Medication Sig Dispense Refill  . ALPRAZolam (XANAX) 0.5 MG tablet TAKE 1/2 TABLET BY MOUTH EVERY DAY AS NEEDED FOR ANXIETY  0  . amLODipine (NORVASC) 10 MG tablet Take by mouth.    Marland Kitchen. aspirin EC 81 MG tablet Take 81 mg by mouth daily.    Marland Kitchen. esomeprazole (NEXIUM) 20 MG capsule Take 20 mg by mouth daily at 12 noon.    . loratadine (CLARITIN) 10 MG tablet Take 10 mg by mouth daily.    . meclizine (ANTIVERT) 32 MG tablet One half tablet every 8 hours if needed for dizziness. 30 tablet 0  . metoprolol succinate (TOPROL-XL) 100 MG 24 hr tablet Take 100 mg by mouth daily. Take with or immediately following a meal.    . pravastatin (PRAVACHOL) 80 MG tablet Take by mouth.     No current facility-administered medications on file prior to visit.     There are no Patient Instructions on file for this visit. No Follow-up on file.   Loriene Taunton A Mena Simonis, PA-C

## 2017-05-20 ENCOUNTER — Ambulatory Visit: Payer: Medicare Other | Admitting: Podiatry

## 2017-08-06 ENCOUNTER — Ambulatory Visit (INDEPENDENT_AMBULATORY_CARE_PROVIDER_SITE_OTHER): Payer: Medicare Other | Admitting: Vascular Surgery

## 2017-08-06 ENCOUNTER — Encounter (INDEPENDENT_AMBULATORY_CARE_PROVIDER_SITE_OTHER): Payer: Self-pay | Admitting: Vascular Surgery

## 2017-08-06 ENCOUNTER — Ambulatory Visit (INDEPENDENT_AMBULATORY_CARE_PROVIDER_SITE_OTHER): Payer: Medicare Other

## 2017-08-06 VITALS — BP 159/67 | HR 57 | Resp 16 | Ht 63.0 in | Wt 205.0 lb

## 2017-08-06 DIAGNOSIS — M79604 Pain in right leg: Secondary | ICD-10-CM | POA: Diagnosis not present

## 2017-08-06 DIAGNOSIS — M79605 Pain in left leg: Secondary | ICD-10-CM

## 2017-08-06 DIAGNOSIS — E785 Hyperlipidemia, unspecified: Secondary | ICD-10-CM | POA: Diagnosis not present

## 2017-08-06 DIAGNOSIS — I10 Essential (primary) hypertension: Secondary | ICD-10-CM

## 2017-08-06 NOTE — Patient Instructions (Signed)

## 2017-08-06 NOTE — Assessment & Plan Note (Signed)
lipid control important in reducing the progression of atherosclerotic disease. Continue statin therapy  

## 2017-08-06 NOTE — Progress Notes (Signed)
MRN : [id number]  Lisa Reynolds is a 82 y.o. (January 02, 1935) female who presents with chief complaint of  Chief Complaint  Patient presents with  . Follow-up    Pt. conv. ABI BLE vein reflux  .  History of Present Illness: Patient returns in follow up for leg pain and swelling.  She is doing fairly well. She has reported wearing her compression stockings regularly and has been having less pain and swelling in her legs. She has no new ulceration or infection. Her left leg has more pain in her right leg has more swelling. Her studies today showed normal ABIs and waveforms bilaterally with normal digital pressures consistent with no arterial insufficiency. Her venous duplex today demonstrates bilateral great saphenous vein reflux with no DVT or superficial thrombophlebitis.    Past Medical History:  Diagnosis Date  . Diverticulitis   . Diverticulosis of colon (without mention of hemorrhage)   . GERD (gastroesophageal reflux disease)   . Hypertension   . Sleep apnea    C-pap  . Stroke [facility])     Past Surgical History:  Procedure Laterality Date  . ABDOMINAL HYSTERECTOMY  1980  . BLADDER SURGERY  1998  . COLON SURGERY  2006  . COLONOSCOPY  2011    submucosal lipoma of the descending colon  . HERNIA REPAIR  2010   VENTRAL  . LAPAROSCOPIC SALPINGOOPHERECTOMY  1980    Social History Social History  Substance Use Topics  . Smoking status: Never Smoker  . Smokeless tobacco: Never Used  . Alcohol use No    Family History Family History  Problem Relation Age of Onset  . Colon cancer Sister        70    Current Outpatient Prescriptions  Medication Sig Dispense Refill  . ALPRAZolam (XANAX) 0.5 MG tablet TAKE 1/2 TABLET BY MOUTH EVERY DAY AS NEEDED FOR ANXIETY  0  . amLODipine (NORVASC) 10 MG tablet Take by mouth.    Marland Kitchen aspirin EC 81 MG tablet Take 81 mg by mouth daily.    Marland Kitchen dronedarone (MULTAQ) 400 MG tablet TAKE 1 TABLET (400 MG TOTAL) BY MOUTH 2 (TWO) TIMES A DAY WITH MEALS.     Marland Kitchen esomeprazole (NEXIUM) 20 MG capsule Take 20 mg by mouth daily at 12 noon.    . hydrochlorothiazide (HYDRODIURIL) 25 MG tablet Take 25 mg by mouth.    . loratadine (CLARITIN) 10 MG tablet Take 10 mg by mouth daily.    . meclizine (ANTIVERT) 32 MG tablet One half tablet every 8 hours if needed for dizziness. (Patient not taking: Reported on 08/06/2017) 30 tablet 0  . metoprolol succinate (TOPROL-XL) 100 MG 24 hr tablet Take 100 mg by mouth daily. Take with or immediately following a meal.    . pravastatin (PRAVACHOL) 80 MG tablet Take by mouth.     No current facility-administered medications for this visit.     Allergies  Allergen Reactions  . Aspirin Nausea Only  . Codeine Nausea And Vomiting  . Flagyl [Metronidazole]   . Morphine And Related Diarrhea  . Sulfa Antibiotics   . Tape Itching     REVIEW OF SYSTEMS (Negative unless checked)  Constitutional: Weight loss  Fever  Chills Cardiac: Chest pain   Chest pressure   Palpitations   Shortness of breath when laying flat   Shortness of breath at rest   Shortness of breath with exertion. Vascular:  Pain in legs with walking   Pain in legs at rest     Pain in legs when laying flat   Claudication   Pain in feet when walking  Pain in feet at rest  Pain in feet when laying flat   History of DVT   Phlebitis   Swelling in legs   Varicose veins   Non-healing ulcers Pulmonary:   Uses home oxygen   Productive cough   Hemoptysis   Wheeze  COPD    Neurologic:  Dizziness  Blackouts   Seizures   History of stroke   History of TIA  Aphasia   Temporary blindness   Dysphagia   Weakness or numbness in arms   Weakness or numbness in legs Musculoskeletal:  Arthritis   Joint swelling   Joint pain   Low back pain Hematologic:  Easy bruising  Easy bleeding   Hypercoagulable state   Anemic  Thrombocytopenia Gastrointestinal:  Blood in stool   Vomiting blood   Gastroesophageal reflux/heartburn   Difficulty swallowing. Genitourinary:  Chronic kidney disease   Difficult urination  Frequent urination  Burning with urination   Blood in urine Skin:  Rashes   Ulcers   Wounds Psychological:  History of anxiety    History of major depression.  Physical Examination  Vitals:   08/06/17 1559  BP: (!) 159/67  Pulse: (!) 57  Resp: 16  Weight: 93 kg (205 lb)  Height:  (1.6 m)   Body mass index is 36.31 kg/m. Gen:  WD/WN, NAD, appears Younger than stated age Head: [place]/AT, No temporalis wasting. Ear/Nose/Throat: Hearing grossly intact, trachea midline Eyes: Conjunctiva clear. Sclera non-icteric Neck: Supple.  No JVD. Trachea midline Pulmonary:  Good air movement, respirations not labored, no use of accessory muscles.  Cardiac: RRR, normal S1, S2. Vascular:  Vessel Right Left  Radial Palpable Palpable                                    Musculoskeletal: M/S 5/5 throughout.  No deformity or atrophy. 1+ right lower extremity edema, trace left lower extremity edema. Diffuse varicosities bilaterally Neurologic: Sensation grossly intact in extremities.  Symmetrical.  Speech is fluent. Psychiatric: Judgment intact, Mood & affect appropriate for pt's clinical situation. Dermatologic: No rashes or ulcers noted.  No cellulitis or open wounds.      Labs No results found for this or any previous visit (from the past 2160 hour(s)).  Radiology No results found.    Assessment/Plan  Hyperlipidemia lipid control important in reducing the progression of atherosclerotic disease. Continue statin therapy   Essential hypertension blood pressure control important in reducing the progression of atherosclerotic disease. On appropriate oral medications.   Lower extremity pain, bilateral Her studies today showed normal ABIs and waveforms bilaterally with normal digital pressures consistent with no arterial insufficiency.  Her venous duplex today demonstrates bilateral great saphenous vein reflux with no DVT or superficial thrombophlebitis. At current, she is doing well with conservative therapy. We discussed laser ablation and the risks and benefits of the procedure. The patient does not want to have this done at this point which is certainly reasonable given her good response to conservative therapy. I'll plan to see her back in 6 months for follow-up    Festus Barren, MD  08/06/2017 4:55 PM    This note was created with Dragon medical transcription system.  Any errors from dictation are purely unintentional

## 2017-08-06 NOTE — Assessment & Plan Note (Signed)
blood pressure control important in reducing the progression of atherosclerotic disease. On appropriate oral medications.  

## 2017-08-06 NOTE — Assessment & Plan Note (Signed)
Her studies today showed normal ABIs and waveforms bilaterally with normal digital pressures consistent with no arterial insufficiency. Her venous duplex today demonstrates bilateral great saphenous vein reflux with no DVT or superficial thrombophlebitis. At current, she is doing well with conservative therapy. We discussed laser ablation and the risks and benefits of the procedure. The patient does not want to have this done at this point which is certainly reasonable given her good response to conservative therapy. I'll plan to see her back in 6 months for follow-up

## 2018-02-07 ENCOUNTER — Ambulatory Visit (INDEPENDENT_AMBULATORY_CARE_PROVIDER_SITE_OTHER): Payer: Medicare Other | Admitting: Vascular Surgery

## 2018-02-07 ENCOUNTER — Encounter (INDEPENDENT_AMBULATORY_CARE_PROVIDER_SITE_OTHER): Payer: Self-pay | Admitting: Vascular Surgery

## 2018-02-07 VITALS — BP 145/74 | HR 67 | Resp 16 | Ht 63.0 in | Wt 210.0 lb

## 2018-02-07 DIAGNOSIS — E785 Hyperlipidemia, unspecified: Secondary | ICD-10-CM | POA: Diagnosis not present

## 2018-02-07 DIAGNOSIS — I10 Essential (primary) hypertension: Secondary | ICD-10-CM | POA: Diagnosis not present

## 2018-02-07 DIAGNOSIS — I872 Venous insufficiency (chronic) (peripheral): Secondary | ICD-10-CM | POA: Insufficient documentation

## 2018-02-07 NOTE — Progress Notes (Signed)
MRN : [id number]  Lisa Reynolds is a [age] y.o. (May 06, 1935) female who presents with chief complaint of  Chief Complaint  Patient presents with  . Follow-up    65month   .  History of Present Illness: Patient returns today in follow up of venous insufficiency.  She has been doing reasonably well with reasonably mild to moderate stable swelling of both lower extremities.  The right leg swells more than the left.  No new ulceration or infection.  Occasionally heavy and tired, but all in all the pain is mild.  She has a known history of venous reflux, but has opted to continue conservative therapy and is pleased with that choice so far.       Past Medical History:  Diagnosis Date  . Diverticulitis   . Diverticulosis of colon (without mention of hemorrhage)   . GERD (gastroesophageal reflux disease)   . Hypertension   . Sleep apnea    C-pap  . Stroke [facility])     Past Surgical History:  Procedure Laterality Date  . ABDOMINAL HYSTERECTOMY  1980  . BLADDER SURGERY  1998  . COLON SURGERY  2006  . COLONOSCOPY  2011    submucosal lipoma of the descending colon  . HERNIA REPAIR  2010   VENTRAL  . LAPAROSCOPIC SALPINGOOPHERECTOMY  1980    Social History     Social History  Substance Use Topics  . Smoking status: Never Smoker  . Smokeless tobacco: Never Used  . Alcohol use No    Family History      Family History  Problem Relation Age of Onset  . Colon cancer Sister        1556          Current Outpatient Prescriptions  Medication Sig Dispense Refill  . ALPRAZolam (XANAX) 0.5 MG tablet TAKE 1/2 TABLET BY MOUTH EVERY DAY AS NEEDED FOR ANXIETY  0  . amLODipine (NORVASC) 10 MG tablet Take by mouth.    Marland Kitchen. aspirin EC 81 MG tablet Take 81 mg by mouth daily.    Marland Kitchen. dronedarone (MULTAQ) 400 MG tablet TAKE 1 TABLET (400 MG TOTAL) BY MOUTH 2 (TWO) TIMES A DAY WITH MEALS.    Marland Kitchen. esomeprazole (NEXIUM) 20 MG capsule Take 20 mg by mouth daily at 12 noon.      . hydrochlorothiazide (HYDRODIURIL) 25 MG tablet Take 25 mg by mouth.    . loratadine (CLARITIN) 10 MG tablet Take 10 mg by mouth daily.    . meclizine (ANTIVERT) 32 MG tablet One half tablet every 8 hours if needed for dizziness. (Patient not taking: Reported on 08/06/2017) 30 tablet 0  . metoprolol succinate (TOPROL-XL) 100 MG 24 hr tablet Take 100 mg by mouth daily. Take with or immediately following a meal.    . pravastatin (PRAVACHOL) 80 MG tablet Take by mouth.     No current facility-administered medications for this visit.         Allergies  Allergen Reactions  . Aspirin Nausea Only  . Codeine Nausea And Vomiting  . Flagyl [Metronidazole]   . Morphine And Related Diarrhea  . Sulfa Antibiotics   . Tape Itching     REVIEW OF SYSTEMS (Negative unless checked)  Constitutional: [] Weight loss  [] Fever  [] Chills Cardiac: [] Chest pain   [] Chest pressure   [] Palpitations   [] Shortness of breath when laying flat   [] Shortness of breath at rest   [] Shortness of breath with exertion. Vascular:  [x] Pain in legs with  walking   [x] Pain in legs at rest   [] Pain in legs when laying flat   [] Claudication   [] Pain in feet when walking  [] Pain in feet at rest  [] Pain in feet when laying flat   [] History of DVT   [] Phlebitis   [x] Swelling in legs   [x] Varicose veins   [] Non-healing ulcers Pulmonary:   [] Uses home oxygen   [] Productive cough   [] Hemoptysis   [] Wheeze  [] COPD    Neurologic:  [x] Dizziness  [] Blackouts   [] Seizures   [] History of stroke   [] History of TIA  [] Aphasia   [] Temporary blindness   [] Dysphagia   [] Weakness or numbness in arms   [] Weakness or numbness in legs Musculoskeletal:  [] Arthritis   [] Joint swelling   [] Joint pain   [] Low back pain Hematologic:  [] Easy bruising  [] Easy bleeding   [] Hypercoagulable state   [] Anemic  [] Thrombocytopenia Gastrointestinal:  [] Blood in stool   [] Vomiting blood  [x] Gastroesophageal reflux/heartburn   [] Difficulty  swallowing. Genitourinary:  [] Chronic kidney disease   [] Difficult urination  [] Frequent urination  [] Burning with urination   [] Blood in urine Skin:  [] Rashes   [] Ulcers   [] Wounds Psychological:  [] History of anxiety   []  History of major depression.    Physical Examination  BP (!) 145/74 (BP Location: Right Arm)   Pulse 67   Resp 16   Ht 5\' 3"  (1.6 m)   Wt 95.3 kg (210 lb)   BMI 37.20 kg/m  Gen:  WD/WN, NAD.  Appears younger than stated age Head: [place]/AT, No temporalis wasting. Ear/Nose/Throat: Hearing grossly intact, nares w/o erythema or drainage Eyes: Conjunctiva clear. Sclera non-icteric Neck: Supple.  Trachea midline Pulmonary:  Good air movement, no use of accessory muscles.  Cardiac: RRR, no JVD Vascular:  Vessel Right Left  Radial Palpable Palpable                          PT  trace palpable  1+ palpable  DP Palpable Palpable    Musculoskeletal: M/S 5/5 throughout.  No deformity or atrophy.  Diffuse varicosities bilaterally.  1+ right lower extremity edema, trace left lower extremity edema. Neurologic: Sensation grossly intact in extremities.  Symmetrical.  Speech is fluent.  Psychiatric: Judgment intact, Mood & affect appropriate for pt's clinical situation. Dermatologic: No rashes or ulcers noted.  No cellulitis or open wounds.       Labs No results found for this or any previous visit (from the past 2160 hour(s)).  Radiology No results found.  Assessment/Plan Hyperlipidemia lipid control important in reducing the progression of atherosclerotic disease. Continue statin therapy   Essential hypertension blood pressure control important in reducing the progression of atherosclerotic disease. On appropriate oral medications.    Chronic venous insufficiency The patient continues to do reasonably well with conservative therapy.  No change in her plan of care at current.  Would continue compression stockings, elevation, and exercise.  Return to  clinic in 1 year    Festus Barren, MD  02/07/2018 4:30 PM    This note was created with Dragon medical transcription system.  Any errors from dictation are purely unintentional

## 2018-02-07 NOTE — Assessment & Plan Note (Signed)
The patient continues to do reasonably well with conservative therapy.  No change in her plan of care at current.  Would continue compression stockings, elevation, and exercise.  Return to clinic in 1 year

## 2018-02-07 NOTE — Patient Instructions (Signed)

## 2018-04-22 ENCOUNTER — Ambulatory Visit (INDEPENDENT_AMBULATORY_CARE_PROVIDER_SITE_OTHER): Payer: Medicare Other | Admitting: Vascular Surgery

## 2018-04-22 ENCOUNTER — Encounter (INDEPENDENT_AMBULATORY_CARE_PROVIDER_SITE_OTHER): Payer: Self-pay | Admitting: Vascular Surgery

## 2018-04-22 VITALS — BP 144/69 | HR 66 | Resp 16 | Ht 63.0 in | Wt 209.8 lb

## 2018-04-22 DIAGNOSIS — I890 Lymphedema, not elsewhere classified: Secondary | ICD-10-CM | POA: Diagnosis not present

## 2018-04-22 DIAGNOSIS — M79605 Pain in left leg: Secondary | ICD-10-CM | POA: Diagnosis not present

## 2018-04-22 DIAGNOSIS — I872 Venous insufficiency (chronic) (peripheral): Secondary | ICD-10-CM

## 2018-04-22 DIAGNOSIS — M79604 Pain in right leg: Secondary | ICD-10-CM

## 2018-04-22 NOTE — Progress Notes (Signed)
Subjective:    Patient ID: Lisa Reynolds, female    DOB: [birth year], 83 y.o.   MRN: [id number] Chief Complaint  Patient presents with  . Follow-up    left leg pain and swelling   Patient presents with a chief complaint of continued bilateral lower extremity pain.  The patient was last seen in our office on February 17, 2018 and was noted that her lower extremity pain and edema was being controlled to the use of conservative therapy.  The patient notes that she continues to engage in conservative therapy on a daily basis including wearing medical grade 1 compression socks, elevating her legs and remaining active however she is experiencing progressively worsening lower extremity pain.  The patient also experiences lower extremity edema.  This edema is also associated with discomfort.  The patient's pain and edema worsens towards the end of the day or with sitting and standing for long periods of time.  The patient denies any recent surgery or trauma to the bilateral lower extremity.  An ABI conducted on October 2018 was notable for no significant lower arterial disease.  A bilateral lower extremity venous duplex conducted at the same time was notable for incompetence of the bilateral great saphenous veins.  The patient denies any rest pain or ulceration formation to the bilateral lower extremity.  The patient notes that her discomfort has progressed to the point that she is unable to function on a daily basis and her symptoms have become lifestyle limiting.  The patient denies any fever, nausea vomiting.  Review of Systems  Constitutional: Negative.   HENT: Negative.   Eyes: Negative.   Respiratory: Negative.   Cardiovascular: Positive for leg swelling.       Lower extremity pain  Gastrointestinal: Negative.   Endocrine: Negative.   Genitourinary: Negative.   Musculoskeletal: Negative.   Skin: Negative.   Allergic/Immunologic: Negative.   Neurological: Negative.   Hematological: Negative.     Psychiatric/Behavioral: Negative.       Objective:   Physical Exam  Constitutional: She is oriented to person, place, and time. She appears well-developed and well-nourished. No distress.  HENT:  Head: Normocephalic and atraumatic.  Right Ear: External ear normal.  Left Ear: External ear normal.  Eyes: Pupils are equal, round, and reactive to light. Conjunctivae and EOM are normal.  Neck: Normal range of motion.  Cardiovascular: Normal rate, regular rhythm, normal heart sounds and intact distal pulses.  Pulses:      Radial pulses are 2+ on the right side, and 2+ on the left side.       Dorsalis pedis pulses are 1+ on the right side, and 1+ on the left side.       Posterior tibial pulses are 1+ on the right side, and 1+ on the left side.  Pulmonary/Chest: Effort normal and breath sounds normal.  Musculoskeletal: Normal range of motion. She exhibits edema (Mild nonpitting bilateral lower extremity edema).  Neurological: She is alert and oriented to person, place, and time.  Skin: Skin is warm and dry. She is not diaphoretic.  Scattered less than 1 cm varicosities noted to the bilateral lower extremity.  There is no stasis dermatitis, fibrosis, cellulitis or active ulcerations noted to the bilateral legs  Psychiatric: She has a normal mood and affect. Her behavior is normal. Judgment and thought content normal.  Vitals reviewed.  BP (!) 144/69 (BP Location: Right Arm)   Pulse 66   Resp 16   Ht 5\' 3"  (1.6 m)  Wt 209 lb 12.8 oz (95.2 kg)   BMI 37.16 kg/m   Past Medical History:  Diagnosis Date  . Diverticulitis   . Diverticulosis of colon (without mention of hemorrhage)   . GERD (gastroesophageal reflux disease)   . Hypertension   . Sleep apnea    C-pap  . Stroke [facility](HCC)    Social History   Socioeconomic History  . Marital status: Married    Spouse name: Not on file  . Number of children: Not on file  . Years of education: Not on file  . Highest education level: Not on  file  Occupational History  . Not on file  Social Needs  . Financial resource strain: Not on file  . Food insecurity:    Worry: Not on file    Inability: Not on file  . Transportation needs:    Medical: Not on file    Non-medical: Not on file  Tobacco Use  . Smoking status: Never Smoker  . Smokeless tobacco: Never Used  Substance and Sexual Activity  . Alcohol use: No  . Drug use: No  . Sexual activity: Not on file  Lifestyle  . Physical activity:    Days per week: Not on file    Minutes per session: Not on file  . Stress: Not on file  Relationships  . Social connections:    Talks on phone: Not on file    Gets together: Not on file    Attends religious service: Not on file    Active member of club or organization: Not on file    Attends meetings of clubs or organizations: Not on file    Relationship status: Not on file  . Intimate partner violence:    Fear of current or ex partner: Not on file    Emotionally abused: Not on file    Physically abused: Not on file    Forced sexual activity: Not on file  Other Topics Concern  . Not on file  Social History Narrative  . Not on file   Family History  Problem Relation Age of Onset  . Colon cancer Sister        [id number]   Allergies  Allergen Reactions  . Aspirin Nausea Only  . Codeine Nausea And Vomiting  . Flagyl [Metronidazole]   . Morphine And Related Diarrhea  . Sulfa Antibiotics   . Tape Itching      Assessment & Plan:  Patient presents with a chief complaint of continued bilateral lower extremity pain.  The patient was last seen in our office on February 17, 2018 and was noted that her lower extremity pain and edema was being controlled to the use of conservative therapy.  The patient notes that she continues to engage in conservative therapy on a daily basis including wearing medical grade 1 compression socks, elevating her legs and remaining active however she is experiencing progressively worsening lower extremity  pain.  The patient also experiences lower extremity edema.  This edema is also associated with discomfort.  The patient's pain and edema worsens towards the end of the day or with sitting and standing for long periods of time.  The patient denies any recent surgery or trauma to the bilateral lower extremity.  An ABI conducted on October 2018 was notable for no significant lower arterial disease.  A bilateral lower extremity venous duplex conducted at the same time was notable for incompetence of the bilateral great saphenous veins.  The patient denies any rest pain or  ulceration formation to the bilateral lower extremity.  The patient notes that her discomfort has progressed to the point that she is unable to function on a daily basis and her symptoms have become lifestyle limiting.  The patient denies any fever, nausea vomiting.  1. Chronic venous insufficiency - Stable The patient has been engaging in conservative therapy for over 6 months including wearing medical grade 1 compression, elevating her legs and remaining active. Over the last few weeks, the patient's symptoms including bilateral lower extremity pain and edema have worsened. The patient was found to have venous incompetence of the bilateral great saphenous veins on a venous duplex conducted in October 2018. At this time, the patient has failed conservative therapy The patient is likely to benefit from endovenous laser ablation. I have discussed the risks and benefits of the procedure. The risks primarily include DVT, recanalization, bleeding, infection, and inability to gain access. I will applied to the patient's insurance The patient is to continue engaging conservative therapy until I receive insurance approval  2. Lower extremity pain, bilateral - Stable I do not feel the patient's lower extremity discomfort is from arterial insufficiency as the patient has normal ABIs and palpable pedal pulses  3. Lymphedema - New At this time, the  patient has failed conservative therapy The patient is interested in moving forward with endovenous laser ablation to the bilateral great saphenous veins The patient may be a candidate for a lymphedema pump in the future  Current Outpatient Medications on File Prior to Visit  Medication Sig Dispense Refill  . ALPRAZolam (XANAX) 0.5 MG tablet TAKE 1/2 TABLET BY MOUTH EVERY DAY AS NEEDED FOR ANXIETY  0  . amLODipine (NORVASC) 10 MG tablet Take by mouth.    Marland Kitchen aspirin EC 81 MG tablet Take 81 mg by mouth daily.    Marland Kitchen dronedarone (MULTAQ) 400 MG tablet TAKE 1 TABLET (400 MG TOTAL) BY MOUTH 2 (TWO) TIMES A DAY WITH MEALS.    Marland Kitchen ELIQUIS 5 MG TABS tablet TAKE 1 TABLET (5 MG TOTAL) BY MOUTH TWO (2) TIMES A DAY.  3  . esomeprazole (NEXIUM) 20 MG capsule Take 20 mg by mouth daily at 12 noon.    . loratadine (CLARITIN) 10 MG tablet Take 10 mg by mouth daily.    . metoprolol succinate (TOPROL-XL) 100 MG 24 hr tablet Take 100 mg by mouth daily. Take with or immediately following a meal.    . pravastatin (PRAVACHOL) 80 MG tablet Take by mouth.    . hydrochlorothiazide (HYDRODIURIL) 25 MG tablet Take 25 mg by mouth.    . meclizine (ANTIVERT) 32 MG tablet One half tablet every 8 hours if needed for dizziness. (Patient not taking: Reported on 08/06/2017) 30 tablet 0   No current facility-administered medications on file prior to visit.    There are no Patient Instructions on file for this visit. No follow-ups on file.  Ladaisha Portillo A Ndidi Nesby, PA-C

## 2018-05-13 ENCOUNTER — Other Ambulatory Visit: Payer: Self-pay | Admitting: Unknown Physician Specialty

## 2018-05-13 DIAGNOSIS — M25552 Pain in left hip: Secondary | ICD-10-CM

## 2018-05-27 ENCOUNTER — Ambulatory Visit
Admission: RE | Admit: 2018-05-27 | Discharge: 2018-05-27 | Disposition: A | Discharge status: Home / Self Care | Payer: Medicare Other | Source: Ambulatory Visit | Attending: Unknown Physician Specialty | Admitting: Unknown Physician Specialty

## 2018-05-27 DIAGNOSIS — M1612 Unilateral primary osteoarthritis, left hip: Secondary | ICD-10-CM | POA: Insufficient documentation

## 2018-05-27 DIAGNOSIS — M25552 Pain in left hip: Secondary | ICD-10-CM | POA: Insufficient documentation

## 2018-06-10 ENCOUNTER — Other Ambulatory Visit (INDEPENDENT_AMBULATORY_CARE_PROVIDER_SITE_OTHER): Payer: PRIVATE HEALTH INSURANCE | Admitting: Vascular Surgery

## 2018-06-12 ENCOUNTER — Encounter (INDEPENDENT_AMBULATORY_CARE_PROVIDER_SITE_OTHER): Payer: Medicare Other

## 2018-08-13 ENCOUNTER — Ambulatory Visit: Payer: Medicare Other | Attending: Otolaryngology

## 2018-12-04 ENCOUNTER — Encounter: Payer: Self-pay | Admitting: *Deleted

## 2018-12-04 ENCOUNTER — Other Ambulatory Visit: Payer: Self-pay

## 2018-12-10 NOTE — Discharge Instructions (Signed)

## 2018-12-12 NOTE — Anesthesia Preprocedure Evaluation (Addendum)
Anesthesia Evaluation  Patient identified by MRN, date of birth, ID band Patient awake    Reviewed: Allergy & Precautions, NPO status , Patient's Chart, lab work & pertinent test results  History of Anesthesia Complications (+) history of anesthetic complications (pt reports slow to awaken)  Airway Mallampati: I   Neck ROM: Full    Dental  (+) Upper Dentures   Pulmonary asthma , sleep apnea and Continuous Positive Airway Pressure Ventilation ,    Pulmonary exam normal breath sounds clear to auscultation       Cardiovascular hypertension, Normal cardiovascular exam+ dysrhythmias (a fib on Eliquis)  Rhythm:Regular Rate:Normal     Neuro/Psych PSYCHIATRIC DISORDERS Anxiety Vertigo  TIA   GI/Hepatic GERD  Medicated,  Endo/Other  negative endocrine ROS  Renal/GU Renal disease (stage III CKD)     Musculoskeletal  (+) Arthritis ,   Abdominal   Peds  Hematology negative hematology ROS (+)   Anesthesia Other Findings   Reproductive/Obstetrics                            Anesthesia Physical Anesthesia Plan  ASA: III  Anesthesia Plan: MAC   Post-op Pain Management:    Induction: Intravenous  PONV Risk Score and Plan: 2 and TIVA and Midazolam  Airway Management Planned: Natural Airway  Additional Equipment:   Intra-op Plan:   Post-operative Plan:   Informed Consent: I have reviewed the patients History and Physical, chart, labs and discussed the procedure including the risks, benefits and alternatives for the proposed anesthesia with the patient or authorized representative who has indicated his/her understanding and acceptance.       Plan Discussed with: CRNA  Anesthesia Plan Comments:        Anesthesia Quick Evaluation

## 2018-12-15 ENCOUNTER — Ambulatory Visit: Payer: Medicare Other | Admitting: Anesthesiology

## 2018-12-15 ENCOUNTER — Ambulatory Visit
Admission: RE | Admit: 2018-12-15 | Discharge: 2018-12-15 | Disposition: A | Discharge status: Home / Self Care | Payer: Medicare Other | Attending: Ophthalmology | Admitting: Ophthalmology

## 2018-12-15 ENCOUNTER — Encounter
Admission: RE | Disposition: A | Discharge status: Home / Self Care | Payer: Self-pay | Source: Home / Self Care | Attending: Ophthalmology

## 2018-12-15 DIAGNOSIS — I1310 Hypertensive heart and chronic kidney disease without heart failure, with stage 1 through stage 4 chronic kidney disease, or unspecified chronic kidney disease: Secondary | ICD-10-CM | POA: Diagnosis not present

## 2018-12-15 DIAGNOSIS — Z7901 Long term (current) use of anticoagulants: Secondary | ICD-10-CM | POA: Insufficient documentation

## 2018-12-15 DIAGNOSIS — N183 Chronic kidney disease, stage 3 (moderate): Secondary | ICD-10-CM | POA: Diagnosis not present

## 2018-12-15 DIAGNOSIS — Z8673 Personal history of transient ischemic attack (TIA), and cerebral infarction without residual deficits: Secondary | ICD-10-CM | POA: Insufficient documentation

## 2018-12-15 DIAGNOSIS — Z79899 Other long term (current) drug therapy: Secondary | ICD-10-CM | POA: Insufficient documentation

## 2018-12-15 DIAGNOSIS — Z882 Allergy status to sulfonamides status: Secondary | ICD-10-CM | POA: Insufficient documentation

## 2018-12-15 DIAGNOSIS — Z885 Allergy status to narcotic agent status: Secondary | ICD-10-CM | POA: Insufficient documentation

## 2018-12-15 DIAGNOSIS — G4730 Sleep apnea, unspecified: Secondary | ICD-10-CM | POA: Insufficient documentation

## 2018-12-15 DIAGNOSIS — E7800 Pure hypercholesterolemia, unspecified: Secondary | ICD-10-CM | POA: Insufficient documentation

## 2018-12-15 DIAGNOSIS — M1990 Unspecified osteoarthritis, unspecified site: Secondary | ICD-10-CM | POA: Diagnosis not present

## 2018-12-15 DIAGNOSIS — E1122 Type 2 diabetes mellitus with diabetic chronic kidney disease: Secondary | ICD-10-CM | POA: Insufficient documentation

## 2018-12-15 DIAGNOSIS — Z85828 Personal history of other malignant neoplasm of skin: Secondary | ICD-10-CM | POA: Insufficient documentation

## 2018-12-15 DIAGNOSIS — K219 Gastro-esophageal reflux disease without esophagitis: Secondary | ICD-10-CM | POA: Insufficient documentation

## 2018-12-15 DIAGNOSIS — J45909 Unspecified asthma, uncomplicated: Secondary | ICD-10-CM | POA: Insufficient documentation

## 2018-12-15 DIAGNOSIS — I4891 Unspecified atrial fibrillation: Secondary | ICD-10-CM | POA: Diagnosis not present

## 2018-12-15 DIAGNOSIS — H2511 Age-related nuclear cataract, right eye: Secondary | ICD-10-CM | POA: Insufficient documentation

## 2018-12-15 DIAGNOSIS — E1136 Type 2 diabetes mellitus with diabetic cataract: Secondary | ICD-10-CM | POA: Diagnosis present

## 2018-12-15 HISTORY — DX: Dizziness and giddiness: R42

## 2018-12-15 HISTORY — PX: CATARACT EXTRACTION W/PHACO: SHX586

## 2018-12-15 HISTORY — DX: Adverse effect of unspecified anesthetic, initial encounter: T41.45XA

## 2018-12-15 HISTORY — DX: Presence of dental prosthetic device (complete) (partial): Z97.2

## 2018-12-15 HISTORY — DX: Unspecified asthma, uncomplicated: J45.909

## 2018-12-15 HISTORY — DX: Other complications of anesthesia, initial encounter: T88.59XA

## 2018-12-15 HISTORY — DX: Unspecified atrial fibrillation: I48.91

## 2018-12-15 HISTORY — DX: Prediabetes: R73.03

## 2018-12-15 HISTORY — DX: Unspecified osteoarthritis, unspecified site: M19.90

## 2018-12-15 SURGERY — PHACOEMULSIFICATION, CATARACT, WITH IOL INSERTION
Anesthesia: Monitor Anesthesia Care | Site: Eye | Laterality: Right

## 2018-12-15 SURGERY — PHACOEMULSIFICATION, CATARACT, WITH IOL INSERTION
Anesthesia: Topical | Laterality: Right

## 2018-12-15 MED ORDER — TETRACAINE HCL 0.5 % OP SOLN
1.0000 [drp] | OPHTHALMIC | Status: DC | PRN
  Administered 2018-12-15 (×3): 1 [drp] via OPHTHALMIC

## 2018-12-15 MED ORDER — MOXIFLOXACIN HCL 0.5 % OP SOLN
OPHTHALMIC | Status: DC | PRN
  Administered 2018-12-15: 0.2 mL via OPHTHALMIC

## 2018-12-15 MED ORDER — FENTANYL CITRATE (PF) 100 MCG/2ML IJ SOLN
INTRAMUSCULAR | Status: DC | PRN
  Administered 2018-12-15: 50 ug via INTRAVENOUS

## 2018-12-15 MED ORDER — SODIUM HYALURONATE 23 MG/ML IO SOLN
INTRAOCULAR | Status: DC | PRN
  Administered 2018-12-15: 0.6 mL via INTRAOCULAR

## 2018-12-15 MED ORDER — EPINEPHRINE PF 1 MG/ML IJ SOLN
INTRAOCULAR | Status: DC | PRN
  Administered 2018-12-15: 102 mL via OPHTHALMIC

## 2018-12-15 MED ORDER — ARMC OPHTHALMIC DILATING DROPS
1.0000 "application " | OPHTHALMIC | Status: DC | PRN
  Administered 2018-12-15 (×3): 1 via OPHTHALMIC

## 2018-12-15 MED ORDER — ONDANSETRON HCL 4 MG/2ML IJ SOLN: 4.0000 mg | Freq: Once | INTRAMUSCULAR | Status: DC | PRN

## 2018-12-15 MED ORDER — MIDAZOLAM HCL 2 MG/2ML IJ SOLN
INTRAMUSCULAR | Status: DC | PRN
  Administered 2018-12-15 (×2): 1 mg via INTRAVENOUS

## 2018-12-15 MED ORDER — LACTATED RINGERS IV SOLN: 10.0000 mL/h | INTRAVENOUS | Status: DC

## 2018-12-15 MED ORDER — LIDOCAINE HCL (PF) 2 % IJ SOLN
INTRAOCULAR | Status: DC | PRN
  Administered 2018-12-15: 2 mL via INTRAOCULAR

## 2018-12-15 MED ORDER — SODIUM HYALURONATE 10 MG/ML IO SOLN
INTRAOCULAR | Status: DC | PRN
  Administered 2018-12-15: 0.55 mL via INTRAOCULAR

## 2018-12-15 SURGICAL SUPPLY — 19 items
CANNULA ANT/CHMB 27G (MISCELLANEOUS) ×2 IMPLANT
CANNULA ANT/CHMB 27GA (MISCELLANEOUS) ×6 IMPLANT
DISSECTOR HYDRO NUCLEUS 50X22 (MISCELLANEOUS) ×3 IMPLANT
GLOVE SURG LX 7.5 STRW (GLOVE) ×2
GLOVE SURG LX STRL 7.5 STRW (GLOVE) ×1 IMPLANT
GLOVE SURG SYN 8.5  E (GLOVE) ×2
GLOVE SURG SYN 8.5 E (GLOVE) ×1 IMPLANT
GLOVE SURG SYN 8.5 PF PI (GLOVE) ×1 IMPLANT
GOWN STRL REUS W/ TWL LRG LVL3 (GOWN DISPOSABLE) ×2 IMPLANT
GOWN STRL REUS W/TWL LRG LVL3 (GOWN DISPOSABLE) ×4
LENS IOL TECNIS ITEC 15.5 (Intraocular Lens) ×2 IMPLANT
MARKER SKIN DUAL TIP RULER LAB (MISCELLANEOUS) ×3 IMPLANT
PACK DR. KING ARMS (PACKS) ×3 IMPLANT
PACK EYE AFTER SURG (MISCELLANEOUS) ×3 IMPLANT
PACK OPTHALMIC (MISCELLANEOUS) ×3 IMPLANT
SYR 3ML LL SCALE MARK (SYRINGE) ×3 IMPLANT
SYR TB 1ML LUER SLIP (SYRINGE) ×3 IMPLANT
WATER STERILE IRR 500ML POUR (IV SOLUTION) ×3 IMPLANT
WIPE NON LINTING 3.25X3.25 (MISCELLANEOUS) ×3 IMPLANT

## 2018-12-15 NOTE — H&P (Signed)

## 2018-12-15 NOTE — Transfer of Care (Signed)
Immediate Anesthesia Transfer of Care Note  Patient: Lisa Reynolds  Procedure(s) Performed: CATARACT EXTRACTION PHACO AND INTRAOCULAR LENS PLACEMENT (IOC)  RIGHT DIABETIC (Right Eye)  Patient Location: PACU  Anesthesia Type: MAC  Level of Consciousness: awake, alert  and patient cooperative  Airway and Oxygen Therapy: Patient Spontanous Breathing and Patient connected to supplemental oxygen  Post-op Assessment: Post-op Vital signs reviewed, Patient's Cardiovascular Status Stable, Respiratory Function Stable, Patent Airway and No signs of Nausea or vomiting  Post-op Vital Signs: Reviewed and stable  Complications: No apparent anesthesia complications

## 2018-12-15 NOTE — Anesthesia Postprocedure Evaluation (Signed)
Anesthesia Post Note  Patient: Lisa Reynolds  Procedure(s) Performed: CATARACT EXTRACTION PHACO AND INTRAOCULAR LENS PLACEMENT (IOC)  RIGHT DIABETIC (Right Eye)  Patient location during evaluation: PACU Anesthesia Type: MAC Level of consciousness: awake and alert, oriented and patient cooperative Pain management: pain level controlled Vital Signs Assessment: post-procedure vital signs reviewed and stable Respiratory status: spontaneous breathing, nonlabored ventilation and respiratory function stable Cardiovascular status: blood pressure returned to baseline and stable Postop Assessment: adequate PO intake Anesthetic complications: no    Darrin Nipper

## 2018-12-15 NOTE — Anesthesia Procedure Notes (Signed)
Procedure Name: MAC Date/Time: 12/15/2018 9:53 AM Performed by: Janna Arch, CRNA Pre-anesthesia Checklist: Patient identified, Emergency Drugs available, Suction available, Timeout performed and Patient being monitored Patient Re-evaluated:Patient Re-evaluated prior to induction Oxygen Delivery Method: Nasal cannula Placement Confirmation: positive ETCO2

## 2018-12-15 NOTE — Op Note (Signed)
OPERATIVE NOTE  Lisa Reynolds [id number] 12/15/2018   PREOPERATIVE DIAGNOSIS:  Nuclear sclerotic cataract right eye.  H25.11   POSTOPERATIVE DIAGNOSIS:    Nuclear sclerotic cataract right eye.     PROCEDURE:  Phacoemusification with posterior chamber intraocular lens placement of the right eye   LENS:   Implant Name Type Inv. Item Serial No. Manufacturer Lot No. LRB No. Used  LENS IOL DIOP 15.5 - [id number] Intraocular Lens LENS IOL DIOP 15.5 [id number] AMO  Right 1       PCB00 +15.5   ULTRASOUND TIME: 1 minutes 35 seconds.  CDE 21.46   SURGEON:  Willey Blade, MD, MPH  ANESTHESIOLOGIST: Anesthesiologist: Reed Breech, MD CRNA: Maryan Rued, CRNA   ANESTHESIA:  Topical with tetracaine drops augmented with 1% preservative-free intracameral lidocaine.  ESTIMATED BLOOD LOSS: less than 1 mL.   COMPLICATIONS:  None.   DESCRIPTION OF PROCEDURE:  The patient was identified in the holding room and transported to the operating room and placed in the supine position under the operating microscope.  The right eye was identified as the operative eye and it was prepped and draped in the usual sterile ophthalmic fashion.   A 1.0 millimeter clear-corneal paracentesis was made at the 10:30 position. 0.5 ml of preservative-free 1% lidocaine with epinephrine was injected into the anterior chamber.  The anterior chamber was filled with Healon 5 viscoelastic.  A 2.4 millimeter keratome was used to make a near-clear corneal incision at the 8:00 position.  A curvilinear capsulorrhexis was made with a cystotome and capsulorrhexis forceps.  Balanced salt solution was used to hydrodissect and hydrodelineate the nucleus.   Phacoemulsification was then used in stop and chop fashion to remove the lens nucleus and epinucleus.  The remaining cortex was then removed using the irrigation and aspiration handpiece. Healon was then placed into the capsular bag to distend it for lens placement.  A lens was  then injected into the capsular bag.  The remaining viscoelastic was aspirated.   Wounds were hydrated with balanced salt solution.  The anterior chamber was inflated to a physiologic pressure with balanced salt solution.   Intracameral vigamox 0.1 mL undiluted was injected into the eye and a drop placed onto the ocular surface.  No wound leaks were noted.  The patient was taken to the recovery room in stable condition without complications of anesthesia or surgery  Willey Blade 12/15/2018, 10:18 AM

## 2018-12-16 ENCOUNTER — Encounter: Payer: Self-pay | Admitting: Ophthalmology

## 2018-12-31 ENCOUNTER — Other Ambulatory Visit: Payer: Self-pay

## 2018-12-31 ENCOUNTER — Encounter: Payer: Self-pay | Admitting: *Deleted

## 2019-01-08 NOTE — Discharge Instructions (Signed)

## 2019-01-09 NOTE — Anesthesia Preprocedure Evaluation (Addendum)
Anesthesia Evaluation  Patient identified by MRN, date of birth, ID band Patient awake    Reviewed: Allergy & Precautions, NPO status , Patient's Chart, lab work & pertinent test results  History of Anesthesia Complications (+) history of anesthetic complications (pt reports slow to awaken)  Airway Mallampati: I   Neck ROM: Full    Dental  (+) Upper Dentures   Pulmonary asthma , sleep apnea and Continuous Positive Airway Pressure Ventilation ,    Pulmonary exam normal breath sounds clear to auscultation       Cardiovascular hypertension, Normal cardiovascular exam+ dysrhythmias (a fib on Eliquis)  Rhythm:Regular Rate:Normal     Neuro/Psych PSYCHIATRIC DISORDERS Anxiety Vertigo  TIA   GI/Hepatic GERD  Medicated,  Endo/Other  negative endocrine ROS  Renal/GU Renal disease (stage III CKD)     Musculoskeletal  (+) Arthritis ,   Abdominal   Peds  Hematology negative hematology ROS (+)   Anesthesia Other Findings   Reproductive/Obstetrics                            Anesthesia Physical Anesthesia Plan  ASA: III  Anesthesia Plan: MAC   Post-op Pain Management:    Induction: Intravenous  PONV Risk Score and Plan: 2 and TIVA and Midazolam  Airway Management Planned: Natural Airway  Additional Equipment:   Intra-op Plan:   Post-operative Plan:   Informed Consent: I have reviewed the patients History and Physical, chart, labs and discussed the procedure including the risks, benefits and alternatives for the proposed anesthesia with the patient or authorized representative who has indicated his/her understanding and acceptance.       Plan Discussed with: CRNA  Anesthesia Plan Comments:        Anesthesia Quick Evaluation  

## 2019-01-12 ENCOUNTER — Ambulatory Visit
Admission: RE | Admit: 2019-01-12 | Discharge: 2019-01-12 | Disposition: A | Discharge status: Home / Self Care | Payer: Medicare Other | Attending: Ophthalmology | Admitting: Ophthalmology

## 2019-01-12 ENCOUNTER — Encounter
Admission: RE | Disposition: A | Discharge status: Home / Self Care | Payer: Self-pay | Source: Home / Self Care | Attending: Ophthalmology

## 2019-01-12 ENCOUNTER — Ambulatory Visit: Payer: Medicare Other | Admitting: Anesthesiology

## 2019-01-12 DIAGNOSIS — G4730 Sleep apnea, unspecified: Secondary | ICD-10-CM | POA: Insufficient documentation

## 2019-01-12 DIAGNOSIS — N183 Chronic kidney disease, stage 3 (moderate): Secondary | ICD-10-CM | POA: Diagnosis not present

## 2019-01-12 DIAGNOSIS — Z9049 Acquired absence of other specified parts of digestive tract: Secondary | ICD-10-CM | POA: Diagnosis not present

## 2019-01-12 DIAGNOSIS — E7800 Pure hypercholesterolemia, unspecified: Secondary | ICD-10-CM | POA: Insufficient documentation

## 2019-01-12 DIAGNOSIS — H2512 Age-related nuclear cataract, left eye: Secondary | ICD-10-CM | POA: Insufficient documentation

## 2019-01-12 DIAGNOSIS — Z8673 Personal history of transient ischemic attack (TIA), and cerebral infarction without residual deficits: Secondary | ICD-10-CM | POA: Insufficient documentation

## 2019-01-12 DIAGNOSIS — Z7901 Long term (current) use of anticoagulants: Secondary | ICD-10-CM | POA: Insufficient documentation

## 2019-01-12 DIAGNOSIS — Z85828 Personal history of other malignant neoplasm of skin: Secondary | ICD-10-CM | POA: Insufficient documentation

## 2019-01-12 DIAGNOSIS — I4891 Unspecified atrial fibrillation: Secondary | ICD-10-CM | POA: Diagnosis not present

## 2019-01-12 DIAGNOSIS — K219 Gastro-esophageal reflux disease without esophagitis: Secondary | ICD-10-CM | POA: Insufficient documentation

## 2019-01-12 DIAGNOSIS — I129 Hypertensive chronic kidney disease with stage 1 through stage 4 chronic kidney disease, or unspecified chronic kidney disease: Secondary | ICD-10-CM | POA: Insufficient documentation

## 2019-01-12 DIAGNOSIS — Z79899 Other long term (current) drug therapy: Secondary | ICD-10-CM | POA: Insufficient documentation

## 2019-01-12 DIAGNOSIS — Z9989 Dependence on other enabling machines and devices: Secondary | ICD-10-CM | POA: Insufficient documentation

## 2019-01-12 HISTORY — PX: CATARACT EXTRACTION W/PHACO: SHX586

## 2019-01-12 SURGERY — PHACOEMULSIFICATION, CATARACT, WITH IOL INSERTION
Anesthesia: Monitor Anesthesia Care | Site: Eye | Laterality: Left

## 2019-01-12 MED ORDER — TETRACAINE HCL 0.5 % OP SOLN
1.0000 [drp] | OPHTHALMIC | Status: DC | PRN
  Administered 2019-01-12 (×2): 1 [drp] via OPHTHALMIC

## 2019-01-12 MED ORDER — MOXIFLOXACIN HCL 0.5 % OP SOLN
OPHTHALMIC | Status: DC | PRN
  Administered 2019-01-12: 0.2 mL via OPHTHALMIC

## 2019-01-12 MED ORDER — MIDAZOLAM HCL 2 MG/2ML IJ SOLN
INTRAMUSCULAR | Status: DC | PRN
  Administered 2019-01-12 (×2): 1 mg via INTRAVENOUS

## 2019-01-12 MED ORDER — SODIUM HYALURONATE 23 MG/ML IO SOLN
INTRAOCULAR | Status: DC | PRN
  Administered 2019-01-12: 0.6 mL via INTRAOCULAR

## 2019-01-12 MED ORDER — EPINEPHRINE PF 1 MG/ML IJ SOLN
INTRAOCULAR | Status: DC | PRN
  Administered 2019-01-12: 90 mL via OPHTHALMIC

## 2019-01-12 MED ORDER — LACTATED RINGERS IV SOLN: INTRAVENOUS | Status: DC

## 2019-01-12 MED ORDER — LIDOCAINE HCL (PF) 2 % IJ SOLN
INTRAOCULAR | Status: DC | PRN
  Administered 2019-01-12: 2 mL via INTRAOCULAR

## 2019-01-12 MED ORDER — ACETAMINOPHEN 160 MG/5ML PO SOLN: 325.0000 mg | ORAL | Status: DC | PRN

## 2019-01-12 MED ORDER — ACETAMINOPHEN 325 MG PO TABS: 650.0000 mg | ORAL_TABLET | Freq: Once | ORAL | Status: DC | PRN

## 2019-01-12 MED ORDER — ARMC OPHTHALMIC DILATING DROPS
1.0000 "application " | OPHTHALMIC | Status: DC | PRN
  Administered 2019-01-12 (×3): 1 via OPHTHALMIC

## 2019-01-12 MED ORDER — FENTANYL CITRATE (PF) 100 MCG/2ML IJ SOLN
INTRAMUSCULAR | Status: DC | PRN
  Administered 2019-01-12: 50 ug via INTRAVENOUS

## 2019-01-12 MED ORDER — SODIUM HYALURONATE 10 MG/ML IO SOLN
INTRAOCULAR | Status: DC | PRN
  Administered 2019-01-12: 0.55 mL via INTRAOCULAR

## 2019-01-12 MED ORDER — ONDANSETRON HCL 4 MG/2ML IJ SOLN: 4.0000 mg | Freq: Once | INTRAMUSCULAR | Status: DC | PRN

## 2019-01-12 SURGICAL SUPPLY — 19 items
CANNULA ANT/CHMB 27G (MISCELLANEOUS) ×2 IMPLANT
CANNULA ANT/CHMB 27GA (MISCELLANEOUS) ×4 IMPLANT
DISSECTOR HYDRO NUCLEUS 50X22 (MISCELLANEOUS) ×2 IMPLANT
GLOVE SURG LX 7.5 STRW (GLOVE) ×1
GLOVE SURG LX STRL 7.5 STRW (GLOVE) ×1 IMPLANT
GLOVE SURG SYN 8.5  E (GLOVE) ×1
GLOVE SURG SYN 8.5 E (GLOVE) ×1 IMPLANT
GLOVE SURG SYN 8.5 PF PI (GLOVE) ×1 IMPLANT
GOWN STRL REUS W/ TWL LRG LVL3 (GOWN DISPOSABLE) ×2 IMPLANT
GOWN STRL REUS W/TWL LRG LVL3 (GOWN DISPOSABLE) ×2
LENS IOL TECNIS 16.5 (Intraocular Lens) ×1 IMPLANT
MARKER SKIN DUAL TIP RULER LAB (MISCELLANEOUS) ×2 IMPLANT
PACK DR. KING ARMS (PACKS) ×2 IMPLANT
PACK EYE AFTER SURG (MISCELLANEOUS) ×2 IMPLANT
PACK OPTHALMIC (MISCELLANEOUS) ×2 IMPLANT
SYR 3ML LL SCALE MARK (SYRINGE) ×2 IMPLANT
SYR TB 1ML LUER SLIP (SYRINGE) ×2 IMPLANT
WATER STERILE IRR 500ML POUR (IV SOLUTION) ×2 IMPLANT
WIPE NON LINTING 3.25X3.25 (MISCELLANEOUS) ×2 IMPLANT

## 2019-01-12 NOTE — H&P (Signed)

## 2019-01-12 NOTE — Op Note (Signed)
OPERATIVE NOTE  Lisa Reynolds [id number] 01/12/2019   PREOPERATIVE DIAGNOSIS:  Nuclear sclerotic cataract left eye.  H25.12   POSTOPERATIVE DIAGNOSIS:    Nuclear sclerotic cataract left eye.     PROCEDURE:  Phacoemusification with posterior chamber intraocular lens placement of the left eye   LENS:   Implant Name Type Inv. Item Serial No. Manufacturer Lot No. LRB No. Used  LENS IOL TECNIS 16.5 - [id number] Intraocular Lens LENS IOL TECNIS 16.5 [id number] AMO  Left 1       PCB00 +16.5   ULTRASOUND TIME: 1 minutes 07 seconds.  CDE 16.19   SURGEON:  Willey Blade, MD, MPH   ANESTHESIA:  Topical with tetracaine drops augmented with 1% preservative-free intracameral lidocaine.  ESTIMATED BLOOD LOSS: <1 mL   COMPLICATIONS:  None.   DESCRIPTION OF PROCEDURE:  The patient was identified in the holding room and transported to the operating room and placed in the supine position under the operating microscope.  The left eye was identified as the operative eye and it was prepped and draped in the usual sterile ophthalmic fashion.   A 1.0 millimeter clear-corneal paracentesis was made at the 5:00 position. 0.5 ml of preservative-free 1% lidocaine with epinephrine was injected into the anterior chamber.  The anterior chamber was filled with Healon 5 viscoelastic.  A 2.4 millimeter keratome was used to make a near-clear corneal incision at the 2:00 position.  A curvilinear capsulorrhexis was made with a cystotome and capsulorrhexis forceps.  Balanced salt solution was used to hydrodissect and hydrodelineate the nucleus.   Phacoemulsification was then used in stop and chop fashion to remove the lens nucleus and epinucleus.  The remaining cortex was then removed using the irrigation and aspiration handpiece. Healon was then placed into the capsular bag to distend it for lens placement.  A lens was then injected into the capsular bag.  The remaining viscoelastic was aspirated.   Wounds were hydrated  with balanced salt solution.  The anterior chamber was inflated to a physiologic pressure with balanced salt solution.  Intracameral vigamox 0.1 mL undiltued was injected into the eye and a drop placed onto the ocular surface.  No wound leaks were noted.  The patient was taken to the recovery room in stable condition without complications of anesthesia or surgery  Willey Blade 01/12/2019, 7:56 AM

## 2019-01-12 NOTE — Anesthesia Procedure Notes (Signed)
Procedure Name: MAC Date/Time: 01/12/2019 7:33 AM Performed by: Cameron Ali, CRNA Pre-anesthesia Checklist: Patient identified, Emergency Drugs available, Suction available, Timeout performed and Patient being monitored Patient Re-evaluated:Patient Re-evaluated prior to induction Oxygen Delivery Method: Nasal cannula Placement Confirmation: positive ETCO2

## 2019-01-12 NOTE — Anesthesia Postprocedure Evaluation (Signed)
Anesthesia Post Note  Patient: Lisa Reynolds  Procedure(s) Performed: CATARACT EXTRACTION PHACO AND INTRAOCULAR LENS PLACEMENT (IOC)  LEFT BORDERLINE DIABETIC (Left Eye)  Patient location during evaluation: PACU Anesthesia Type: MAC Level of consciousness: awake and alert, oriented and patient cooperative Pain management: pain level controlled Vital Signs Assessment: post-procedure vital signs reviewed and stable Respiratory status: spontaneous breathing, nonlabored ventilation and respiratory function stable Cardiovascular status: blood pressure returned to baseline and stable Postop Assessment: adequate PO intake Anesthetic complications: no    Darrin Nipper

## 2019-01-12 NOTE — Transfer of Care (Signed)
Immediate Anesthesia Transfer of Care Note  Patient: Lisa Reynolds  Procedure(s) Performed: CATARACT EXTRACTION PHACO AND INTRAOCULAR LENS PLACEMENT (IOC)  LEFT BORDERLINE DIABETIC (Left Eye)  Patient Location: PACU  Anesthesia Type: MAC  Level of Consciousness: awake, alert  and patient cooperative  Airway and Oxygen Therapy: Patient Spontanous Breathing and Patient connected to supplemental oxygen  Post-op Assessment: Post-op Vital signs reviewed, Patient's Cardiovascular Status Stable, Respiratory Function Stable, Patent Airway and No signs of Nausea or vomiting  Post-op Vital Signs: Reviewed and stable  Complications: No apparent anesthesia complications

## 2019-01-13 ENCOUNTER — Encounter: Payer: Self-pay | Admitting: Ophthalmology

## 2019-02-10 ENCOUNTER — Ambulatory Visit (INDEPENDENT_AMBULATORY_CARE_PROVIDER_SITE_OTHER): Payer: Medicare Other | Admitting: Vascular Surgery

## 2019-06-09 ENCOUNTER — Encounter: Payer: Self-pay | Admitting: General Surgery

## 2020-08-01 ENCOUNTER — Telehealth: Payer: Self-pay

## 2020-08-01 NOTE — Telephone Encounter (Signed)
Left message regarding scheduling colonoscopy-need to know if she wants to remain with Dr.Byrnett -if not we need to place referral in epic.

## 2020-10-01 ENCOUNTER — Ambulatory Visit (INDEPENDENT_AMBULATORY_CARE_PROVIDER_SITE_OTHER): Payer: Medicare Other

## 2020-10-01 ENCOUNTER — Encounter: Payer: Self-pay | Admitting: Emergency Medicine

## 2020-10-01 ENCOUNTER — Other Ambulatory Visit: Payer: Self-pay

## 2020-10-01 ENCOUNTER — Ambulatory Visit
Admission: EM | Admit: 2020-10-01 | Discharge: 2020-10-01 | Disposition: A | Discharge status: Home / Self Care | Payer: Medicare Other | Attending: Emergency Medicine | Admitting: Emergency Medicine

## 2020-10-01 DIAGNOSIS — M25531 Pain in right wrist: Secondary | ICD-10-CM

## 2020-10-01 DIAGNOSIS — S60211A Contusion of right wrist, initial encounter: Secondary | ICD-10-CM | POA: Diagnosis not present

## 2020-10-01 DIAGNOSIS — T148XXA Other injury of unspecified body region, initial encounter: Secondary | ICD-10-CM | POA: Diagnosis not present

## 2020-10-01 DIAGNOSIS — M25511 Pain in right shoulder: Secondary | ICD-10-CM | POA: Diagnosis not present

## 2020-10-01 DIAGNOSIS — W19XXXA Unspecified fall, initial encounter: Secondary | ICD-10-CM | POA: Diagnosis not present

## 2020-10-01 DIAGNOSIS — S40011A Contusion of right shoulder, initial encounter: Secondary | ICD-10-CM | POA: Diagnosis not present

## 2020-10-01 NOTE — ED Triage Notes (Signed)
Pt c/o fall around 11:30 am on her front porch. Pt states her foot got caught in something and she fell on her right side. Pt has multiple lacerations to her right hand and elbow. Pt states she laid on the porch for about an hour unable to get up and crawl inside. Pt states she took two tylenol around 1 pm. She has right shoulder pain, right elbow pain, and right hand pain. She has limited ROM.

## 2020-10-01 NOTE — ED Notes (Signed)
Wounds cleaned and covered with bacitracin. Non stick tefla applied and wrapped with coban.

## 2020-10-01 NOTE — Discharge Instructions (Signed)
You were seen for a fall and are being treated for right shoulder and right wrist pain; right hand and right elbow skin abrasions.   Change bandages on your abrasions as needed.  If they get wet or soiled, make sure you change them to you clean and dry bandages.  You do not need to put any ointments or treatments to the area.  Treat your pain with over-the-counter Tylenol.  Follow-up with your primary care provider approximately 4 weeks.  Take care, Dr. Sharlet Salina, NP-c

## 2020-10-02 NOTE — ED Provider Notes (Addendum)
[facility] - [place], [place]   Name: Lisa Reynolds DOB: May 23, 1935 MRN: [id number] CSN: [id number] PCP: Mickey Farber, MD  Arrival date and time:  10/01/20 1359  Chief Complaint:  Fall, Elbow Injury, Hand Injury, and Shoulder Injury   NOTE: Prior to seeing the patient today, I have reviewed the triage nursing documentation and vital signs. Clinical staff has updated patient's PMH/PSHx, current medication list, and drug allergies/intolerances to ensure comprehensive history available to assist in medical decision making.   History:   HPI: Lisa Reynolds is a 85 y.o. female who presents today with complaints of right shoulder and wrist pain and abrasions to her right hand and right elbow after a fall.  At approximately 1130 this morning patient was on her front porch, when her foot got caught on something and she fell onto her right side.  She was unable to get herself up after the fall and had to "crawl" inside the house.  Her daughter then noticed her and brought her to the urgent care for further evaluation.  Prior to presenting to the urgent care today, cleaned her wound with hydrogen peroxide and water.   Patient's daughter is present visit.  Patient denies any loss of consciousness with the fall.  She states she is able to move all her upper and lower extremities; her right shoulder and wrist however has some mild discomfort with movement.  Of note, patient is on Eliquis for atrial fibrillation.  Past Medical History:  Diagnosis Date  . Arthritis    hips, spine, knees  . Asthma   . Atrial fibrillation (HCC)   . Complication of anesthesia    slow to wake after hernia surg  . Diverticulitis   . Diverticulosis of colon (without mention of hemorrhage)   . GERD (gastroesophageal reflux disease)   . Hypertension   . Pre-diabetes   . Sleep apnea    C-pap  . Stroke (HCC)   . Vertigo    major episode 2017.  several minor episodes since.  . Wears dentures    full upper     Past Surgical History:  Procedure Laterality Date  . ABDOMINAL HYSTERECTOMY  1980  . BLADDER SURGERY  1998  . CATARACT EXTRACTION W/PHACO Right 12/15/2018   Procedure: CATARACT EXTRACTION PHACO AND INTRAOCULAR LENS PLACEMENT (IOC)  RIGHT DIABETIC;  Surgeon: Nevada Crane, MD;  Location: [facility];  Service: Ophthalmology;  Laterality: Right;  pre-diabetic  sleep apnea  . CATARACT EXTRACTION W/PHACO Left 01/12/2019   Procedure: CATARACT EXTRACTION PHACO AND INTRAOCULAR LENS PLACEMENT (IOC)  LEFT BORDERLINE DIABETIC;  Surgeon: Nevada Crane, MD;  Location: [facility];  Service: Ophthalmology;  Laterality: Left;  Pre-diabetic sleep apnea  . COLON SURGERY  2006  . COLONOSCOPY  2011    submucosal lipoma of the descending colon  . HERNIA REPAIR  2010   VENTRAL  . LAPAROSCOPIC SALPINGOOPHERECTOMY  1980    Family History  Problem Relation Age of Onset  . Colon cancer Sister        41    Social History   Tobacco Use  . Smoking status: Never Smoker  . Smokeless tobacco: Never Used  Vaping Use  . Vaping Use: Never used  Substance Use Topics  . Alcohol use: No  . Drug use: No    Patient Active Problem List   Diagnosis Date Noted  . Lymphedema 04/22/2018  . Chronic venous insufficiency 02/07/2018  . Hyperlipidemia 03/19/2017  . Essential hypertension  03/19/2017  . Lower extremity pain, bilateral 03/19/2017  . Dysphagia 02/02/2016  . Benign paroxysmal positional vertigo 02/02/2016    Home Medications:    No outpatient medications have been marked as taking for the 10/01/20 encounter [facility] Encounter).    Allergies:   Aspirin, Codeine, Flagyl [metronidazole], Morphine and related, Sulfa antibiotics, and Tape  Review of Systems (ROS): Review of Systems  Constitutional: Negative for appetite change and fatigue.  Musculoskeletal: Positive for joint swelling and myalgias.  Skin: Positive for wound.  Neurological: Negative for dizziness,  syncope, weakness and headaches.     Vital Signs: Today's Vitals   10/01/20 1509 10/01/20 1513 10/01/20 1624  BP:  (!) 165/62   Pulse:  94   Temp:  97.7 F (36.5 C)   TempSrc:  Oral   SpO2:  98%   PainSc: 0-No pain  0-No pain    Physical Exam: Physical Exam Vitals and nursing note reviewed.  Constitutional:      General: She is not in acute distress.    Appearance: Normal appearance.  Cardiovascular:     Rate and Rhythm: Normal rate and regular rhythm.     Pulses: Normal pulses.     Heart sounds: Normal heart sounds.  Pulmonary:     Effort: Pulmonary effort is normal.     Breath sounds: Normal breath sounds.  Musculoskeletal:     Left shoulder: No tenderness. Decreased range of motion. Normal strength. Normal pulse.     Left wrist: No bony tenderness or snuff box tenderness. Normal range of motion. Normal pulse.     Cervical back: Normal range of motion.     Comments: Slight decreased in passive ROM to shoulder  Skin:    General: Skin is warm and dry.     Capillary Refill: Capillary refill takes less than 2 seconds.     Findings: Abrasion and bruising present.       Neurological:     General: No focal deficit present.     Mental Status: She is alert and oriented to person, place, and time. Mental status is at baseline.  Psychiatric:        Mood and Affect: Mood normal.      Urgent Care Treatments / Results:   LABS: PLEASE NOTE: all labs that were ordered this encounter are listed, however only abnormal results are displayed. Labs Reviewed - No data to display  EKG: -None  RADIOLOGY: DG Shoulder Right  Result Date: 10/01/2020 CLINICAL DATA:  Fall.  Right shoulder pain. EXAM: RIGHT SHOULDER - 2+ VIEW COMPARISON:  Chest radiograph 11/21/2011 FINDINGS: Mild degenerative right AC joint spurring. Mild spurring of the inferior right glenoid. No dislocation. No discrete fracture is identified. Atherosclerotic calcification of the aortic arch. IMPRESSION: 1. No  acute findings. Mild degenerative findings. Electronically Signed   By: Gaylyn Rong M.D.   On: 10/01/2020 16:07   DG Wrist Complete Right  Result Date: 10/01/2020 CLINICAL DATA:  Status post fall. EXAM: RIGHT WRIST - COMPLETE 3+ VIEW COMPARISON:  None. FINDINGS: Diffusely decreased bone density. There is no evidence of fracture or dislocation. At least moderate osteoarthrosis of the first carpometacarpal joint. Mild joint space narrowing of the radiocarpal joint. Mild carpal joint space narrowing. No aggressive appearing focal bone abnormality. Soft tissues are unremarkable. IMPRESSION: No acute displaced fracture or dislocation of the bones of the right wrist Electronically Signed   By: Tish Frederickson M.D.   On: 10/01/2020 16:07    PROCEDURES: Procedures  MEDICATIONS RECEIVED THIS  VISIT: Medications - No data to display  PERTINENT CLINICAL COURSE NOTES/UPDATES:   Initial Impression / Assessment and Plan / Urgent Care Course:  Pertinent labs & imaging results that were available during my care of the patient were personally reviewed by me and considered in my medical decision making (see lab/imaging section of note for values and interpretations).  Lisa Reynolds is a 85 y.o. female who presents to [facility] today with complaints of pain and skin tear status post fall, diagnosed with the same, and treated as such with the directions below. NP and patient reviewed discharge instructions below during visit.   Patient is well appearing overall in clinic today. She does not appear to be in any acute distress. Presenting symptoms (see HPI) and exam as documented above.   I have reviewed the follow up and strict return precautions for any new or worsening symptoms. Patient is aware of symptoms that would be deemed urgent/emergent, and would thus require further evaluation either here or in the emergency department. At the time of discharge, she verbalized understanding and consent with  the discharge plan as it was reviewed with her. All questions were fielded by provider and/or clinic staff prior to patient discharge.    Final Clinical Impressions / Urgent Care Diagnoses:   Final diagnoses:  Fall, initial encounter  Contusion of right shoulder, initial encounter  Contusion of right wrist, initial encounter  Skin abrasion    New Prescriptions:  [place] Controlled Substance Registry consulted? Not Applicable  No orders of the defined types were placed in this encounter.     Discharge Instructions     You were seen for a fall and are being treated for right shoulder and right wrist pain; right hand and right elbow skin abrasions.   Change bandages on your abrasions as needed.  If they get wet or soiled, make sure you change them to you clean and dry bandages.  You do not need to put any ointments or treatments to the area.  Treat your pain with over-the-counter Tylenol.  Follow-up with your primary care provider approximately 4 weeks.  Take care, Dr. Sharlet Salina, NP-c     Recommended Follow up Care:  Patient encouraged to follow up with the following provider within the specified time frame, or sooner as dictated by the severity of her symptoms. As always, she was instructed that for any urgent/emergent care needs, she should seek care either here or in the emergency department for more immediate evaluation.   Bailey Mech, DNP, NP-c    Bailey Mech, NP 10/02/20 0805    Bailey Mech, NP 10/02/20 [phone]

## 2021-01-08 ENCOUNTER — Other Ambulatory Visit: Payer: Self-pay

## 2021-01-08 ENCOUNTER — Encounter: Payer: Self-pay | Admitting: Emergency Medicine

## 2021-01-08 ENCOUNTER — Ambulatory Visit
Admission: EM | Admit: 2021-01-08 | Discharge: 2021-01-08 | Disposition: A | Discharge status: Other Acute Inpatient Hospital | Payer: Medicare Other | Attending: Physician Assistant | Admitting: Physician Assistant

## 2021-01-08 DIAGNOSIS — I169 Hypertensive crisis, unspecified: Secondary | ICD-10-CM

## 2021-01-08 DIAGNOSIS — R079 Chest pain, unspecified: Secondary | ICD-10-CM

## 2021-01-08 DIAGNOSIS — R0602 Shortness of breath: Secondary | ICD-10-CM

## 2021-01-08 NOTE — ED Triage Notes (Signed)
Patient is being discharged from the Urgent Care and sent to the Emergency Department via POV . Per Athena Masse, PA, patient is in need of higher level of care due to chest pain. Patient is aware and verbalizes understanding of plan of care.  Vitals:   01/08/21 1021  BP: (S) (!) 192/69  Pulse: 65  Resp: 16  Temp: 98.1 F (36.7 C)  SpO2: 99%

## 2021-01-08 NOTE — Discharge Instructions (Addendum)

## 2021-01-08 NOTE — ED Triage Notes (Signed)
Patient c/o chest pain, heart palpitations and SOB that started 2-3 days ago.  Patient has Atrial Fib.

## 2021-01-08 NOTE — ED Provider Notes (Addendum)
[facility]    CSN: [id number] Arrival date & time: 01/08/21  1010      History   Chief Complaint Chief Complaint  Patient presents with  . Palpitations  . Chest Pain    HPI Lisa Reynolds is a 86 y.o. female presenting for 2 to 3-day history of substernal chest pain.  She describes it as a "pressure" and rates it about 7/10 in severity.  Patient states that it does occasionally radiate to bilateral shoulders.  Pain is worse when she is up and moving around.  She also admits to some pain in her legs when she is moving around that resolves when she rests.  Nothing makes the chest pain better.  She states that it is getting worse every day.  It is associated with feeling short of breath and weakness in her legs.  She states that "I just do not feel right."  Patient does have history of A. fib and concerned about potentially being in A. fib currently.  She has been experiencing some palpitations as well.  She does take Eliquis and another medication for rate control.  Patient denies any history of heart attack, but has had a stroke.  Other medical history significant for hypertension, asthma, prediabetes and sleep apnea.  Patient denies any recent sick contacts or Covid exposure.  She is fully vaccinated for COVID-19.  She has no other concerns today.  HPI  Past Medical History:  Diagnosis Date  . Arthritis    hips, spine, knees  . Asthma   . Atrial fibrillation (HCC)   . Complication of anesthesia    slow to wake after hernia surg  . Diverticulitis   . Diverticulosis of colon (without mention of hemorrhage)   . GERD (gastroesophageal reflux disease)   . Hypertension   . Pre-diabetes   . Sleep apnea    C-pap  . Stroke (HCC)   . Vertigo    major episode 2017.  several minor episodes since.  . Wears dentures    full upper    Patient Active Problem List   Diagnosis Date Noted  . Lymphedema 04/22/2018  . Chronic venous insufficiency 02/07/2018  . Hyperlipidemia  03/19/2017  . Essential hypertension 03/19/2017  . Lower extremity pain, bilateral 03/19/2017  . Dysphagia 02/02/2016  . Benign paroxysmal positional vertigo 02/02/2016    Past Surgical History:  Procedure Laterality Date  . ABDOMINAL HYSTERECTOMY  1980  . BLADDER SURGERY  1998  . CATARACT EXTRACTION W/PHACO Right 12/15/2018   Procedure: CATARACT EXTRACTION PHACO AND INTRAOCULAR LENS PLACEMENT (IOC)  RIGHT DIABETIC;  Surgeon: Nevada CraneKing, Bradley Mark, MD;  [facility];  Service: Ophthalmology;  Laterality: Right;  pre-diabetic  sleep apnea  . CATARACT EXTRACTION W/PHACO Left 01/12/2019   Procedure: CATARACT EXTRACTION PHACO AND INTRAOCULAR LENS PLACEMENT (IOC)  LEFT BORDERLINE DIABETIC;  Surgeon: Nevada CraneKing, Bradley Mark, MD;  [facility];  Service: Ophthalmology;  Laterality: Left;  Pre-diabetic sleep apnea  . COLON SURGERY  2006  . COLONOSCOPY  2011    submucosal lipoma of the descending colon  . HERNIA REPAIR  2010   VENTRAL  . LAPAROSCOPIC SALPINGOOPHERECTOMY  1980    OB History   No obstetric history on file.      Home Medications    Prior to Admission medications   Medication Sig Start Date End Date Taking? Authorizing Provider  albuterol (VENTOLIN HFA) 108 (90 Base) MCG/ACT inhaler Inhale into the lungs. 07/24/18  Yes [provider]  amLODipine (NORVASC) 10 MG tablet Take by mouth. 04/05/15  Yes [provider]  ELIQUIS 5 MG TABS tablet TAKE 1 TABLET (5 MG TOTAL) BY MOUTH TWO (2) TIMES A DAY. 01/05/18  Yes [provider]  esomeprazole (NEXIUM) 20 MG capsule Take 20 mg by mouth daily at 12 noon.   Yes [provider]  ezetimibe (ZETIA) 10 MG tablet Take by mouth. 12/11/19  Yes [provider]  hydrochlorothiazide (HYDRODIURIL) 25 MG tablet Take 25 mg by mouth. 01/23/17 01/08/21 Yes [provider]  loratadine (CLARITIN) 10 MG tablet Take 10 mg by mouth daily.   Yes [provider]  metoprolol  succinate (TOPROL-XL) 100 MG 24 hr tablet Take 25 mg by mouth daily. Take with or immediately following a meal.   Yes [provider]  Vitamin D, Ergocalciferol, 50 MCG (2000 UT) CAPS Take by mouth at bedtime.   Yes [provider]  acetaminophen (TYLENOL) 325 MG tablet Take by mouth.    [provider]  dronedarone (MULTAQ) 400 MG tablet TAKE 1 TABLET (400 MG TOTAL) BY MOUTH 2 (TWO) TIMES A DAY WITH MEALS. 01/11/17   [provider]  meclizine (ANTIVERT) 32 MG tablet One half tablet every 8 hours if needed for dizziness. Patient not taking: Reported on 08/06/2017 02/01/16   Earline Mayotte, MD  pravastatin (PRAVACHOL) 80 MG tablet Take by mouth. 04/05/15   [provider]    Family History Family History  Problem Relation Age of Onset  . Colon cancer Sister        61    Social History Social History   Tobacco Use  . Smoking status: Never Smoker  . Smokeless tobacco: Never Used  Vaping Use  . Vaping Use: Never used  Substance Use Topics  . Alcohol use: No  . Drug use: No     Allergies   Aspirin, Codeine, Flagyl [metronidazole], Morphine and related, Sulfa antibiotics, and Tape   Review of Systems Review of Systems  Constitutional: Positive for fatigue. Negative for chills, diaphoresis and fever.  HENT: Negative for congestion, rhinorrhea and sore throat.   Respiratory: Positive for chest tightness and shortness of breath. Negative for cough and wheezing.   Cardiovascular: Positive for chest pain and palpitations.  Gastrointestinal: Negative for abdominal pain, nausea and vomiting.  Musculoskeletal: Negative for arthralgias and myalgias.  Skin: Negative for rash.  Neurological: Positive for weakness. Negative for dizziness and headaches.  Hematological: Negative for adenopathy.     Physical Exam Triage Vital Signs ED Triage Vitals  Enc Vitals Group     BP 01/08/21 1021 (S) (!) 192/69     Pulse Rate 01/08/21 1021 65      Resp 01/08/21 1021 16     Temp 01/08/21 1021 98.1 F (36.7 C)     Temp Source 01/08/21 1021 Oral     SpO2 01/08/21 1021 99 %     Weight 01/08/21 1018 209 lb (94.8 kg)     Height 01/08/21 1018 5\' 2"  (1.575 m)     Head Circumference --      Peak Flow --      Pain Score 01/08/21 1018 7     Pain Loc --      Pain Edu? --      Excl. in GC? --    No data found.  Updated Vital Signs BP (S) (!) 192/69 (BP Location: Right Arm)   Pulse 65   Temp 98.1 F (36.7 C) (Oral)  Resp 16   Ht 5\' 2"  (1.575 m)   Wt 209 lb (94.8 kg)   SpO2 99%   BMI 38.23 kg/m       Physical Exam Vitals and nursing note reviewed.  Constitutional:      General: She is not in acute distress.    Appearance: Normal appearance. She is not ill-appearing or toxic-appearing.  HENT:     Head: Normocephalic and atraumatic.     Nose: Nose normal.     Mouth/Throat:     Mouth: Mucous membranes are moist.     Pharynx: Oropharynx is clear.  Eyes:     General: No scleral icterus.       Right eye: No discharge.        Left eye: No discharge.     Conjunctiva/sclera: Conjunctivae normal.  Cardiovascular:     Rate and Rhythm: Normal rate and regular rhythm.     Heart sounds: Normal heart sounds.  Pulmonary:     Effort: Pulmonary effort is normal. No respiratory distress.     Breath sounds: Normal breath sounds.  Chest:     Chest wall: Tenderness (Patient does say that it hurts when I palpate her strenal region, but unsure if it makes pain worse) present.  Abdominal:     Palpations: Abdomen is soft.     Tenderness: There is no abdominal tenderness.  Musculoskeletal:     Cervical back: Neck supple.     Right lower leg: No edema.     Left lower leg: No edema.  Skin:    General: Skin is dry.  Neurological:     General: No focal deficit present.     Mental Status: She is alert and oriented to person, place, and time. Mental status is at baseline.     Cranial Nerves: No cranial nerve deficit.     Motor: No  weakness.     Gait: Gait normal.  Psychiatric:        Mood and Affect: Mood normal.        Behavior: Behavior normal.        Thought Content: Thought content normal.      UC Treatments / Results  Labs (all labs ordered are listed, but only abnormal results are displayed) Labs Reviewed - No data to display  EKG   Radiology No results found.  Procedures ED EKG  Date/Time: 01/08/2021 10:58 AM Performed by: 03/10/2021, PA-C Authorized by: Shirlee Latch, PA-C   Previous ECG:    Previous ECG:  Compared to current   Similarity:  Changes noted   Comparison ECG info:  New 1st degree AV block not seen on 2017 EKG Interpretation:    Interpretation: abnormal   Rate:    ECG rate:  63   ECG rate assessment: normal   Rhythm:    Rhythm: sinus rhythm and A-V block     A-V block: 1st Degree   Ectopy:    Ectopy: none   QRS:    QRS axis:  Normal ST segments:    ST segments:  Normal T waves:    T waves: normal   Comments:     Normal sinus rhythm and regular rate, 1st degree AV block   (including critical care time)  Medications Ordered in UC Medications - No data to display  Initial Impression / Assessment and Plan / UC Course  I have reviewed the triage vital signs and the nursing notes.  Pertinent labs & imaging results that were  available during my care of the patient were reviewed by me and considered in my medical decision making (see chart for details).   86 year old female presenting for substernal chest pain with radiation to the bilateral shoulders with associated shortness of breath, palpitations, fatigue, leg pain and weakness for 2 to 3 days.  Blood pressure elevated at 192/69 in clinic.  Other vital signs are normal and stable.  EKG performed today shows normal sinus rhythm and regular rate with first-degree AV block.  EKG compared to one from 2017 and only real change noted is the first-degree AV block.  No clear ST or T wave abnormality.  Exam reveals a  well-appearing 86 year old female in no acute distress.  She does have some tenderness of her sternal region when I palpate, but she says she does not know if it makes the pain worse or not.  She says that she just knows she has pain in this area.  Her heart is regular rate and rhythm and her chest is clear to auscultation.  No leg swelling and the rest exam is normal.  Discussed the results of EKG with patient and advised her that I am concerned for possible acute coronary syndrome given her symptoms that have been persistent and worsening for 2 to 3 days.  Advised her that she really does need work-up in the ED and to have cardiac monitoring and serial troponins as well as investigation of other causes if necessary.  Advised patient that she needs to go to the ED at this time.  Advised EMS transport, but patient declines and states that her daughter will take her to [facility] in [place] since all of her physicians and her cardiologist is through [facility].  Patient leaving via personal vehicle in stable condition.  I called ahead to [facility] ED and spoke with Marcelino Duster, charge nurse and gave full report on patient.   Final Clinical Impressions(s) / UC Diagnoses   Final diagnoses:  Chest pain, unspecified type  Hypertensive crisis  Shortness of breath     Discharge Instructions     You have been advised to follow up immediately in the emergency department for concerning signs.symptoms. If you declined EMS transport, please have a family member take you directly to the ED at this time. Do not delay. Based on concerns about condition, if you do not follow up in th e ED, you may risk poor outcomes including worsening of condition, delayed treatment and potentially life threatening issues. If you have declined to go to the ED at this time, you should call your PCP immediately to set up a follow up appointment.  Go to ED for red flag symptoms, including; fevers you cannot reduce with Tylenol/Motrin,  severe headaches, vision changes, numbness/weakness in part of the body, lethargy, confusion, intractable vomiting, severe dehydration, chest pain, breathing difficulty, severe persistent abdominal or pelvic pain, signs of severe infection (increased redness, swelling of an area), feeling faint or passing out, dizziness, etc. You should especially go to the ED for sudden acute worsening of condition if you do not elect to go at this time.     ED Prescriptions    None     PDMP not reviewed this encounter.   Shirlee Latch, PA-C 01/08/21 1103    Eusebio Friendly B, PA-C 01/08/21 1104

## 2021-03-31 ENCOUNTER — Other Ambulatory Visit: Payer: Self-pay | Admitting: Family Medicine

## 2021-03-31 DIAGNOSIS — R053 Chronic cough: Secondary | ICD-10-CM

## 2021-03-31 DIAGNOSIS — I69391 Dysphagia following cerebral infarction: Secondary | ICD-10-CM

## 2021-04-17 ENCOUNTER — Other Ambulatory Visit: Payer: Self-pay

## 2021-04-17 ENCOUNTER — Ambulatory Visit
Admission: RE | Admit: 2021-04-17 | Discharge: 2021-04-17 | Disposition: A | Discharge status: Home / Self Care | Payer: Medicare Other | Source: Ambulatory Visit | Attending: Family Medicine | Admitting: Family Medicine

## 2021-04-17 DIAGNOSIS — I69391 Dysphagia following cerebral infarction: Secondary | ICD-10-CM | POA: Insufficient documentation

## 2021-04-17 DIAGNOSIS — R053 Chronic cough: Secondary | ICD-10-CM | POA: Insufficient documentation

## 2021-04-17 NOTE — Therapy (Signed)
[facility] DIAGNOSTIC RADIOLOGY [address] [place], [place], [address] Phone: [phone]   Fax:     Modified Barium Swallow  Patient Details  Name: Lisa Reynolds MRN: [id number] Date of Birth: July 03, 1935 No data recorded  Encounter Date: 04/17/2021   End of Session - 04/17/21 1532     Visit Number 1    Number of Visits 1    Date for SLP Re-Evaluation 04/17/21    SLP Start Time 1252    SLP Stop Time  1320    SLP Time Calculation (min) 28 min    Activity Tolerance Patient tolerated treatment well              There were no vitals filed for this visit.   Subjective Assessment - 04/17/21 1508     Subjective Reports chronic cough, occurs both when she's eating and when she's not eating. At times coughs up "white foamy stuff."    Currently in Pain? No/denies               Objective Swallowing Evaluation: Type of Study: MBS-Modified Barium Swallow Study   Patient Details  Name: Lisa Reynolds MRN: [id number] Date of Birth: Sep 16, 1935  Today's Date: 04/17/2021 Time: SLP Start Time (ACUTE ONLY): 1252 -SLP Stop Time (ACUTE ONLY): 1320  SLP Time Calculation (min) (ACUTE ONLY): 28 min   Past Medical History:  Past Medical History:  Diagnosis Date   Arthritis    hips, spine, knees   Asthma    Atrial fibrillation (HCC)    Complication of anesthesia    slow to wake after hernia surg   Diverticulitis    Diverticulosis of colon (without mention of hemorrhage)    GERD (gastroesophageal reflux disease)    Hypertension    Pre-diabetes    Sleep apnea    C-pap   Stroke [facility])    Vertigo    major episode 2017.  several minor episodes since.   Wears dentures    full upper   Past Surgical History:  Past Surgical History:  Procedure Laterality Date   ABDOMINAL HYSTERECTOMY  1980   BLADDER SURGERY  1998   CATARACT EXTRACTION W/PHACO Right 12/15/2018   Procedure: CATARACT EXTRACTION PHACO AND INTRAOCULAR LENS PLACEMENT (IOC)   RIGHT DIABETIC;  Surgeon: Nevada Crane, MD;  Location: [facility];  Service: Ophthalmology;  Laterality: Right;  pre-diabetic  sleep apnea   CATARACT EXTRACTION W/PHACO Left 01/12/2019   Procedure: CATARACT EXTRACTION PHACO AND INTRAOCULAR LENS PLACEMENT (IOC)  LEFT BORDERLINE DIABETIC;  Surgeon: Nevada Crane, MD;  Location: [facility];  Service: Ophthalmology;  Laterality: Left;  Pre-diabetic sleep apnea   COLON SURGERY  2006   COLONOSCOPY  2011    submucosal lipoma of the descending colon   HERNIA REPAIR  2010   VENTRAL   LAPAROSCOPIC SALPINGOOPHERECTOMY  1980   HPI: Lisa Reynolds is an 86 y.o. female referred by Dr. Maryjane Hurter for dysphagia, chronic cough. History is noted for GERD, diverticulitis, BPPV, HLD, HTN, TIA, "atypical parkinsonism" (2016), atrial fibrillation. Patient had Barium Swallow in 2017 with findings: "tertiary contractions likely source of a sense of dysphagia." Patient reports frequent throat clearing/coughing, coughing after eating or lying down, foreign body sensation at level of thyroid "it feels like there's a lump here all the time."    No data recorded   Assessment / Plan / Recommendation  CHL IP CLINICAL IMPRESSIONS 04/17/2021  Clinical Impression Patient presents with functional oropharyngeal swallow and  suspected primary esophageal dysphagia. Oral stage is characterized by adequate lip closure, bolus preparation, and anterior to posterior transit. Swallow initiation is timely at the level of the base of tongue. Pharyngeal stage is noted for adequate tongue base retraction, hyolaryngeal excursion, and pharyngeal constriction. Epiglottic deflection is complete; there is no penetration or aspiration. Pharyngeal stripping wave is complete with no abnormal pharyngeal residue. Amplitude/duration of cricopharyngeus opening is WFL. There is adequate/complete clearance through the cervical esophagus. An esophageal sweep was performed in the upright  position with mechanical soft solid and sequential boluses of thin liquid, and was noted for reflux per the radiologist. Reflux correlated with reproduction of pt's symptoms (cough, globus sensation) when the pharynx was noted to be clear. Consistencies tested were thin liquids x2 cup sips, nectar x2 cup sips, puree x2 heaping tsps, mechanical soft (1/4 graham cracker in applesauce x2), 3 oz sequential boluses thin liquid. Patient scored 22/45 on the Reflux Symptom Index (a score above 10 indicates a high suspicion of reflux disease). SLP educated on reflux modifications and provided handout with this information. Patient would benefit from GI referral/ further assessment of esophageal function (esophagram). Recommend patient continue regular diet with thin liquids; no further ST indicated at this time.  SLP Visit Diagnosis Dysphagia, unspecified (R13.10)  Attention and concentration deficit following --  Frontal lobe and executive function deficit following --  Impact on safety and function Mild aspiration risk      CHL IP TREATMENT RECOMMENDATION 04/17/2021  Treatment Recommendations No treatment recommended at this time     No flowsheet data found.  CHL IP DIET RECOMMENDATION 04/17/2021  SLP Diet Recommendations Regular solids;Thin liquid  Liquid Administration via Cup  Medication Administration Whole meds with liquid  Compensations Slow rate;Small sips/bites;Follow solids with liquid  Postural Changes Remain semi-upright after after feeds/meals (Comment);Seated upright at 90 degrees      CHL IP OTHER RECOMMENDATIONS 04/17/2021  Recommended Consults Consider GI evaluation;Consider esophageal assessment  Oral Care Recommendations Oral care BID  Other Recommendations --      No flowsheet data found.    No flowsheet data found.         CHL IP ORAL PHASE 04/17/2021  Oral Phase WFL    CHL IP PHARYNGEAL PHASE 04/17/2021  Pharyngeal Phase WFL  Pharyngeal- Nectar Cup [facility]  Pharyngeal  Material does not enter airway  Pharyngeal- Thin Cup [facility]  Pharyngeal Material does not enter airway  Pharyngeal- Puree WFL  Pharyngeal Material does not enter airway  Pharyngeal- Mechanical Soft WFL  Pharyngeal Material does not enter airway     CHL IP CERVICAL ESOPHAGEAL PHASE 04/17/2021  Cervical Esophageal Phase [facility]   Rondel Baton, MS, CCC-SLP Speech-Language Pathologist    Lisa Reynolds 04/17/2021, 3:34 PM                            Chronic cough - Plan: DG SWALLOW FUNC OP MEDICARE SPEECH PATH, DG SWALLOW FUNC OP MEDICARE SPEECH PATH  Dysphagia following cerebral infarction - Plan: DG SWALLOW FUNC OP MEDICARE SPEECH PATH, DG SWALLOW FUNC OP MEDICARE SPEECH PATH        Problem List Patient Active Problem List   Diagnosis Date Noted   Lymphedema 04/22/2018   Chronic venous insufficiency 02/07/2018   Hyperlipidemia 03/19/2017   Essential hypertension 03/19/2017   Lower extremity pain, bilateral 03/19/2017   Dysphagia 02/02/2016   Benign paroxysmal positional vertigo 02/02/2016    Lisa Reynolds 04/17/2021,  3:33 PM  [facility] DIAGNOSTIC RADIOLOGY [address] [place], [place], [address] Phone: [phone]   Fax:     Name: Lisa Reynolds MRN: [id number] Date of Birth: 02-27-1935

## 2022-12-07 DIAGNOSIS — E538 Deficiency of other specified B group vitamins: Secondary | ICD-10-CM | POA: Diagnosis not present

## 2022-12-14 DIAGNOSIS — J069 Acute upper respiratory infection, unspecified: Secondary | ICD-10-CM | POA: Diagnosis not present

## 2022-12-17 DIAGNOSIS — F411 Generalized anxiety disorder: Secondary | ICD-10-CM | POA: Diagnosis not present

## 2022-12-17 DIAGNOSIS — E782 Mixed hyperlipidemia: Secondary | ICD-10-CM | POA: Diagnosis not present

## 2022-12-17 DIAGNOSIS — E1169 Type 2 diabetes mellitus with other specified complication: Secondary | ICD-10-CM | POA: Diagnosis not present

## 2022-12-17 DIAGNOSIS — I152 Hypertension secondary to endocrine disorders: Secondary | ICD-10-CM | POA: Diagnosis not present

## 2022-12-17 DIAGNOSIS — E1159 Type 2 diabetes mellitus with other circulatory complications: Secondary | ICD-10-CM | POA: Diagnosis not present

## 2022-12-17 DIAGNOSIS — N183 Chronic kidney disease, stage 3 unspecified: Secondary | ICD-10-CM | POA: Diagnosis not present

## 2022-12-17 DIAGNOSIS — E1122 Type 2 diabetes mellitus with diabetic chronic kidney disease: Secondary | ICD-10-CM | POA: Diagnosis not present

## 2022-12-17 DIAGNOSIS — I480 Paroxysmal atrial fibrillation: Secondary | ICD-10-CM | POA: Diagnosis not present

## 2023-01-02 ENCOUNTER — Other Ambulatory Visit (HOSPITAL_BASED_OUTPATIENT_CLINIC_OR_DEPARTMENT_OTHER): Payer: Self-pay | Admitting: Nephrology

## 2023-01-02 DIAGNOSIS — E1122 Type 2 diabetes mellitus with diabetic chronic kidney disease: Secondary | ICD-10-CM | POA: Diagnosis not present

## 2023-01-02 DIAGNOSIS — N1831 Chronic kidney disease, stage 3a: Secondary | ICD-10-CM

## 2023-01-02 DIAGNOSIS — I10 Essential (primary) hypertension: Secondary | ICD-10-CM | POA: Diagnosis not present

## 2023-01-07 DIAGNOSIS — E538 Deficiency of other specified B group vitamins: Secondary | ICD-10-CM | POA: Diagnosis not present

## 2023-01-11 ENCOUNTER — Ambulatory Visit
Admission: RE | Admit: 2023-01-11 | Discharge: 2023-01-11 | Disposition: A | Discharge status: Home / Self Care | Payer: Medicare HMO | Source: Ambulatory Visit | Attending: Nephrology | Admitting: Nephrology

## 2023-01-11 DIAGNOSIS — E1122 Type 2 diabetes mellitus with diabetic chronic kidney disease: Secondary | ICD-10-CM | POA: Diagnosis present

## 2023-01-11 DIAGNOSIS — N189 Chronic kidney disease, unspecified: Secondary | ICD-10-CM | POA: Diagnosis not present

## 2023-01-11 DIAGNOSIS — N1831 Chronic kidney disease, stage 3a: Secondary | ICD-10-CM | POA: Diagnosis present

## 2023-01-30 ENCOUNTER — Other Ambulatory Visit (INDEPENDENT_AMBULATORY_CARE_PROVIDER_SITE_OTHER): Payer: Self-pay | Admitting: Vascular Surgery

## 2023-01-30 DIAGNOSIS — I10 Essential (primary) hypertension: Secondary | ICD-10-CM

## 2023-01-30 DIAGNOSIS — E1122 Type 2 diabetes mellitus with diabetic chronic kidney disease: Secondary | ICD-10-CM | POA: Diagnosis not present

## 2023-01-30 DIAGNOSIS — E1169 Type 2 diabetes mellitus with other specified complication: Secondary | ICD-10-CM | POA: Diagnosis not present

## 2023-01-30 DIAGNOSIS — I480 Paroxysmal atrial fibrillation: Secondary | ICD-10-CM | POA: Diagnosis not present

## 2023-01-30 DIAGNOSIS — N183 Chronic kidney disease, stage 3 unspecified: Secondary | ICD-10-CM | POA: Diagnosis not present

## 2023-01-30 DIAGNOSIS — E1159 Type 2 diabetes mellitus with other circulatory complications: Secondary | ICD-10-CM | POA: Diagnosis not present

## 2023-01-30 DIAGNOSIS — F411 Generalized anxiety disorder: Secondary | ICD-10-CM | POA: Diagnosis not present

## 2023-01-30 DIAGNOSIS — E782 Mixed hyperlipidemia: Secondary | ICD-10-CM | POA: Diagnosis not present

## 2023-01-30 DIAGNOSIS — I152 Hypertension secondary to endocrine disorders: Secondary | ICD-10-CM | POA: Diagnosis not present

## 2023-02-04 DIAGNOSIS — I10 Essential (primary) hypertension: Secondary | ICD-10-CM | POA: Diagnosis not present

## 2023-02-04 DIAGNOSIS — N1831 Chronic kidney disease, stage 3a: Secondary | ICD-10-CM | POA: Diagnosis not present

## 2023-02-04 DIAGNOSIS — E1122 Type 2 diabetes mellitus with diabetic chronic kidney disease: Secondary | ICD-10-CM | POA: Diagnosis not present

## 2023-02-06 DIAGNOSIS — K219 Gastro-esophageal reflux disease without esophagitis: Secondary | ICD-10-CM | POA: Insufficient documentation

## 2023-02-06 DIAGNOSIS — I10 Essential (primary) hypertension: Secondary | ICD-10-CM | POA: Insufficient documentation

## 2023-02-06 NOTE — Progress Notes (Signed)
MRN : [id number]  Lisa Reynolds is a 88 y.o. (May 03, 1935) female who presents with chief complaint of check circulation.  History of Present Illness:   The patient is seen for evaluation of malignant hypertension which has been very difficult to control. The patient has a long history of hypertension which recently has become increasingly difficult to control utilizing medical therapy. The patient has episodes of systolic blood pressures near 829 with diastolic pressures over 90.   The patient does not have family history of hypertension.   There is no prior documented abdominal bruit. The patient occasionally has flushing symptoms but denies palpitations. No episodes of syncope.There is no history of headache. There is no history of flash pulmonary edema.  The patient denies a history of renal disease.  No recent shortening of the patient's walking distance or new symptoms consistent with claudication.  No history of rest pain symptoms. No new ulcers or wounds of the lower extremities have occurred.  The patient denies amaurosis fugax or recent TIA symptoms. There are no recent neurological changes noted. There is no history of DVT, PE or superficial thrombophlebitis. No recent episodes of angina or shortness of breath documented.   Duplex ultrasound of the renal arteries demonstrates normal renal arteries normal laminar blood flow normal renal resistive index with a normal kidney size and cortical thickness bilaterally  No outpatient medications have been marked as taking for the 02/07/23 encounter (Appointment) with Gilda Crease, Latina Craver, MD.    Past Medical History:  Diagnosis Date   Arthritis    hips, spine, knees   Asthma    Atrial fibrillation (HCC)    Complication of anesthesia    slow to wake after hernia surg   Diverticulitis    Diverticulosis of colon (without mention of hemorrhage)    GERD (gastroesophageal reflux disease)    Hypertension     Pre-diabetes    Sleep apnea    C-pap   Stroke [facility])    Vertigo    major episode 2017.  several minor episodes since.   Wears dentures    full upper    Past Surgical History:  Procedure Laterality Date   ABDOMINAL HYSTERECTOMY  1980   BLADDER SURGERY  1998   CATARACT EXTRACTION W/PHACO Right 12/15/2018   Procedure: CATARACT EXTRACTION PHACO AND INTRAOCULAR LENS PLACEMENT (IOC)  RIGHT DIABETIC;  Surgeon: Nevada Crane, MD;  Location: [facility];  Service: Ophthalmology;  Laterality: Right;  pre-diabetic  sleep apnea   CATARACT EXTRACTION W/PHACO Left 01/12/2019   Procedure: CATARACT EXTRACTION PHACO AND INTRAOCULAR LENS PLACEMENT (IOC)  LEFT BORDERLINE DIABETIC;  Surgeon: Nevada Crane, MD;  Location: [facility];  Service: Ophthalmology;  Laterality: Left;  Pre-diabetic sleep apnea   COLON SURGERY  2006   COLONOSCOPY  2011    submucosal lipoma of the descending colon   HERNIA REPAIR  2010   VENTRAL   LAPAROSCOPIC SALPINGOOPHERECTOMY  1980    Social History Social History   Tobacco Use   Smoking status: Never   Smokeless tobacco: Never  Vaping Use   Vaping Use: Never used  Substance Use Topics   Alcohol use: No   Drug use: No    Family History Family History  Problem Relation Age of Onset   Colon cancer Sister        47    Allergies  Allergen Reactions   Aspirin Nausea Only  Codeine Nausea And Vomiting   Flagyl [Metronidazole]     Doesn't remember reaction   Morphine And Related Diarrhea   Sulfa Antibiotics     Doesn't remember reaction   Tape Itching     REVIEW OF SYSTEMS (Negative unless checked)  Constitutional: [] Weight loss  [] Fever  [] Chills Cardiac: [] Chest pain   [] Chest pressure   [] Palpitations   [] Shortness of breath when laying flat   [] Shortness of breath with exertion. Vascular:  [x] Pain in legs with walking   [] Pain in legs at rest  [] History of DVT   [] Phlebitis   [] Swelling in legs   [] Varicose veins    [] Non-healing ulcers Pulmonary:   [] Uses home oxygen   [] Productive cough   [] Hemoptysis   [] Wheeze  [] COPD   [x] Asthma Neurologic:  [x] Dizziness   [] Seizures   [] History of stroke   [] History of TIA  [] Aphasia   [] Vissual changes   [] Weakness or numbness in arm   [] Weakness or numbness in leg Musculoskeletal:   [] Joint swelling   [] Joint pain   [] Low back pain Hematologic:  [] Easy bruising  [] Easy bleeding   [] Hypercoagulable state   [] Anemic Gastrointestinal:  [] Diarrhea   [] Vomiting  [x] Gastroesophageal reflux/heartburn   [] Difficulty swallowing. Genitourinary:  [x] Chronic kidney disease   [] Difficult urination  [] Frequent urination   [] Blood in urine Skin:  [] Rashes   [] Ulcers  Psychological:  [] History of anxiety   []  History of major depression.  Physical Examination  There were no vitals filed for this visit. There is no height or weight on file to calculate BMI. Gen: WD/WN, NAD Head: [place]/AT, No temporalis wasting.  Ear/Nose/Throat: Hearing grossly intact, nares w/o erythema or drainage Eyes: PER, EOMI, sclera nonicteric.  Neck: Supple, no masses.  No bruit or JVD.  Pulmonary:  Good air movement, no audible wheezing, no use of accessory muscles.  Cardiac: RRR, normal S1, S2, no Murmurs. Vascular: No carotid or abdominal flank bruits Vessel Right Left  Radial Palpable Palpable  Gastrointestinal: soft, non-distended. No guarding/no peritoneal signs.  Musculoskeletal: M/S 5/5 throughout.  No visible deformity.  Neurologic: CN 2-12 intact. Pain and light touch intact in extremities.  Symmetrical.  Speech is fluent. Motor exam as listed above. Psychiatric: Judgment intact, Mood & affect appropriate for pt's clinical situation. Dermatologic: No rashes or ulcers noted.  No changes consistent with cellulitis.   CBC Lab Results  Component Value Date   WBC 8.4 07/02/2016   HGB 14.4 07/02/2016   HCT 41.9 07/02/2016   MCV 93.7 07/02/2016   PLT 226 07/02/2016    BMET    Component  Value Date/Time   NA 136 07/02/2016 1421   NA 142 11/25/2011 1052   K 3.7 07/02/2016 1421   K 4.1 11/25/2011 1052   CL 104 07/02/2016 1421   CL 105 11/25/2011 1052   CO2 23 07/02/2016 1421   CO2 28 11/25/2011 1052   GLUCOSE 91 07/02/2016 1421   GLUCOSE 139 (H) 11/25/2011 1052   BUN 26 (H) 07/02/2016 1421   BUN 10 11/25/2011 1052   CREATININE 0.86 07/02/2016 1421   CREATININE 0.83 11/14/2012 1219   CALCIUM 9.4 07/02/2016 1421   CALCIUM 8.3 (L) 11/25/2011 1052   GFRNONAA >60 07/02/2016 1421   GFRNONAA >60 11/14/2012 1219   GFRAA >60 07/02/2016 1421   GFRAA >60 11/14/2012 1219   CrCl cannot be calculated (Patient's most recent lab result is older than the maximum 21 days allowed.).  COAG No results found for: "INR", "PROTIME"  Radiology  US RENAL  Result Date: 01/11/2023 CLINICAL DATA:  Chronic renal disease EXAM: RENAL / URINARY TRACT ULTRASOUND COMPLETE COMPARISON:  None Available. FINDINGS: Right Kidney: Renal measurements: 10.6 x 5.0 x 5.3 cm = volume: 148 mL. Echogenicity within normal limits. No mass or hydronephrosis visualized. Left Kidney: Renal measurements: 10.4 x 5.6 x 4.7 cm = volume: 144 mL. Echogenicity within normal limits. No mass or hydronephrosis visualized. Bladder: Appears normal for degree of bladder distention. Other: None. IMPRESSION: Normal study. Electronically Signed   By: Gerome Sam III M.D.   On: 01/11/2023 13:51     Assessment/Plan 1. Malignant hypertension Recommend:  Given patient's mild arterial disease optimal control of the patient's hypertension is important.  The patient's noninvasive studies do not support renal artery stenosis.  No invasive studies or intervention is indicated at this time.  The patient will continue the current antihypertensive medications, no changes at this time.  The primary medical service and nephrology will continue aggressive antihypertensive therapy as per the AHA guidelines I did relate my findings to Dr.  Cherylann Ratel  2. Chronic venous insufficiency Recommend:  No surgery or intervention at this point in time.  I have reviewed my discussion with the patient regarding venous insufficiency and why it causes symptoms. I have discussed with the patient the chronic skin changes that accompany venous insufficiency and the long term sequela such as ulceration. Patient will contnue wearing graduated compression stockings on a daily basis, as this has provided excellent control of his edema. The patient will put the stockings on first thing in the morning and removing them in the evening. The patient is reminded not to sleep in the stockings.  In addition, behavioral modification including elevation during the day will be initiated. Exercise is strongly encouraged.  Previous duplex ultrasound of the lower extremities shows normal deep system, no significant superficial reflux was identified.  Given the patient's good control and lack of any problems regarding the venous insufficiency and lymphedema a lymph pump in not need at this time.    The patient will follow up with me PRN should anything change.  The patient voices agreement with this plan.  3. Lymphedema Recommend:  No surgery or intervention at this point in time.  I have reviewed my discussion with the patient regarding venous insufficiency and why it causes symptoms. I have discussed with the patient the chronic skin changes that accompany venous insufficiency and the long term sequela such as ulceration. Patient will contnue wearing graduated compression stockings on a daily basis, as this has provided excellent control of his edema. The patient will put the stockings on first thing in the morning and removing them in the evening. The patient is reminded not to sleep in the stockings.  In addition, behavioral modification including elevation during the day will be initiated. Exercise is strongly encouraged.  Previous duplex ultrasound of the  lower extremities shows normal deep system, no significant superficial reflux was identified.  Given the patient's good control and lack of any problems regarding the venous insufficiency and lymphedema a lymph pump in not need at this time.    The patient will follow up with me PRN should anything change.  The patient voices agreement with this plan.  4. Hyperlipidemia, unspecified hyperlipidemia type Continue statin as ordered and reviewed, no changes at this time  5. Gastroesophageal reflux disease, unspecified whether esophagitis present Continue PPI as already ordered, this medication has been reviewed and there are no changes at this time.  Avoidence  of caffeine and alcohol  Moderate elevation of the head of the bed     Levora Dredge, MD  02/06/2023 9:05 AM

## 2023-02-07 ENCOUNTER — Encounter (INDEPENDENT_AMBULATORY_CARE_PROVIDER_SITE_OTHER): Payer: Self-pay | Admitting: Vascular Surgery

## 2023-02-07 ENCOUNTER — Ambulatory Visit (INDEPENDENT_AMBULATORY_CARE_PROVIDER_SITE_OTHER): Payer: Medicare HMO

## 2023-02-07 ENCOUNTER — Ambulatory Visit (INDEPENDENT_AMBULATORY_CARE_PROVIDER_SITE_OTHER): Payer: Medicare HMO | Admitting: Vascular Surgery

## 2023-02-07 VITALS — BP 167/84 | HR 67 | Resp 18 | Ht 63.0 in | Wt 202.0 lb

## 2023-02-07 DIAGNOSIS — I872 Venous insufficiency (chronic) (peripheral): Secondary | ICD-10-CM

## 2023-02-07 DIAGNOSIS — I10 Essential (primary) hypertension: Secondary | ICD-10-CM

## 2023-02-07 DIAGNOSIS — K219 Gastro-esophageal reflux disease without esophagitis: Secondary | ICD-10-CM

## 2023-02-07 DIAGNOSIS — I890 Lymphedema, not elsewhere classified: Secondary | ICD-10-CM | POA: Diagnosis not present

## 2023-02-07 DIAGNOSIS — E785 Hyperlipidemia, unspecified: Secondary | ICD-10-CM

## 2023-02-08 ENCOUNTER — Encounter (INDEPENDENT_AMBULATORY_CARE_PROVIDER_SITE_OTHER): Payer: Self-pay | Admitting: Vascular Surgery

## 2023-02-11 DIAGNOSIS — E538 Deficiency of other specified B group vitamins: Secondary | ICD-10-CM | POA: Diagnosis not present

## 2023-02-18 DIAGNOSIS — I480 Paroxysmal atrial fibrillation: Secondary | ICD-10-CM | POA: Diagnosis not present

## 2023-02-18 DIAGNOSIS — J4530 Mild persistent asthma, uncomplicated: Secondary | ICD-10-CM | POA: Diagnosis not present

## 2023-02-18 DIAGNOSIS — E1122 Type 2 diabetes mellitus with diabetic chronic kidney disease: Secondary | ICD-10-CM | POA: Diagnosis not present

## 2023-02-18 DIAGNOSIS — E1159 Type 2 diabetes mellitus with other circulatory complications: Secondary | ICD-10-CM | POA: Diagnosis not present

## 2023-02-18 DIAGNOSIS — Z09 Encounter for follow-up examination after completed treatment for conditions other than malignant neoplasm: Secondary | ICD-10-CM | POA: Diagnosis not present

## 2023-02-18 DIAGNOSIS — N183 Chronic kidney disease, stage 3 unspecified: Secondary | ICD-10-CM | POA: Diagnosis not present

## 2023-02-18 DIAGNOSIS — J3089 Other allergic rhinitis: Secondary | ICD-10-CM | POA: Diagnosis not present

## 2023-02-18 DIAGNOSIS — I700 Atherosclerosis of aorta: Secondary | ICD-10-CM | POA: Diagnosis not present

## 2023-02-18 DIAGNOSIS — I152 Hypertension secondary to endocrine disorders: Secondary | ICD-10-CM | POA: Diagnosis not present

## 2023-02-18 DIAGNOSIS — R053 Chronic cough: Secondary | ICD-10-CM | POA: Diagnosis not present

## 2023-02-18 DIAGNOSIS — G4733 Obstructive sleep apnea (adult) (pediatric): Secondary | ICD-10-CM | POA: Diagnosis not present

## 2023-03-05 DIAGNOSIS — L570 Actinic keratosis: Secondary | ICD-10-CM | POA: Diagnosis not present

## 2023-03-05 DIAGNOSIS — L821 Other seborrheic keratosis: Secondary | ICD-10-CM | POA: Diagnosis not present

## 2023-03-05 DIAGNOSIS — Z85828 Personal history of other malignant neoplasm of skin: Secondary | ICD-10-CM | POA: Diagnosis not present

## 2023-03-05 DIAGNOSIS — L814 Other melanin hyperpigmentation: Secondary | ICD-10-CM | POA: Diagnosis not present

## 2023-03-13 DIAGNOSIS — E538 Deficiency of other specified B group vitamins: Secondary | ICD-10-CM | POA: Diagnosis not present

## 2023-04-15 DIAGNOSIS — E538 Deficiency of other specified B group vitamins: Secondary | ICD-10-CM | POA: Diagnosis not present

## 2023-04-24 DIAGNOSIS — G4733 Obstructive sleep apnea (adult) (pediatric): Secondary | ICD-10-CM | POA: Diagnosis not present

## 2023-04-24 DIAGNOSIS — R0609 Other forms of dyspnea: Secondary | ICD-10-CM | POA: Diagnosis not present

## 2023-04-24 DIAGNOSIS — I440 Atrioventricular block, first degree: Secondary | ICD-10-CM | POA: Diagnosis not present

## 2023-04-24 DIAGNOSIS — I480 Paroxysmal atrial fibrillation: Secondary | ICD-10-CM | POA: Diagnosis not present

## 2023-04-24 DIAGNOSIS — I10 Essential (primary) hypertension: Secondary | ICD-10-CM | POA: Diagnosis not present

## 2023-04-24 DIAGNOSIS — R059 Cough, unspecified: Secondary | ICD-10-CM | POA: Diagnosis not present

## 2023-04-24 DIAGNOSIS — R600 Localized edema: Secondary | ICD-10-CM | POA: Diagnosis not present

## 2023-04-26 DIAGNOSIS — J302 Other seasonal allergic rhinitis: Secondary | ICD-10-CM | POA: Diagnosis not present

## 2023-05-17 DIAGNOSIS — E538 Deficiency of other specified B group vitamins: Secondary | ICD-10-CM | POA: Diagnosis not present

## 2023-05-21 DIAGNOSIS — I358 Other nonrheumatic aortic valve disorders: Secondary | ICD-10-CM | POA: Diagnosis not present

## 2023-05-21 DIAGNOSIS — I359 Nonrheumatic aortic valve disorder, unspecified: Secondary | ICD-10-CM | POA: Diagnosis not present

## 2023-05-21 DIAGNOSIS — I341 Nonrheumatic mitral (valve) prolapse: Secondary | ICD-10-CM | POA: Diagnosis not present

## 2023-06-10 DIAGNOSIS — D631 Anemia in chronic kidney disease: Secondary | ICD-10-CM | POA: Diagnosis not present

## 2023-06-10 DIAGNOSIS — I10 Essential (primary) hypertension: Secondary | ICD-10-CM | POA: Diagnosis not present

## 2023-06-10 DIAGNOSIS — E1122 Type 2 diabetes mellitus with diabetic chronic kidney disease: Secondary | ICD-10-CM | POA: Diagnosis not present

## 2023-06-10 DIAGNOSIS — N1831 Chronic kidney disease, stage 3a: Secondary | ICD-10-CM | POA: Diagnosis not present

## 2023-06-17 DIAGNOSIS — E538 Deficiency of other specified B group vitamins: Secondary | ICD-10-CM | POA: Diagnosis not present

## 2023-07-02 DIAGNOSIS — R051 Acute cough: Secondary | ICD-10-CM | POA: Diagnosis not present

## 2023-07-02 DIAGNOSIS — I517 Cardiomegaly: Secondary | ICD-10-CM | POA: Diagnosis not present

## 2023-07-02 DIAGNOSIS — J0100 Acute maxillary sinusitis, unspecified: Secondary | ICD-10-CM | POA: Diagnosis not present

## 2023-07-18 DIAGNOSIS — E538 Deficiency of other specified B group vitamins: Secondary | ICD-10-CM | POA: Diagnosis not present

## 2023-08-19 DIAGNOSIS — H26492 Other secondary cataract, left eye: Secondary | ICD-10-CM | POA: Diagnosis not present

## 2023-08-19 DIAGNOSIS — Z0100 Encounter for examination of eyes and vision without abnormal findings: Secondary | ICD-10-CM | POA: Diagnosis not present

## 2023-08-19 DIAGNOSIS — H353132 Nonexudative age-related macular degeneration, bilateral, intermediate dry stage: Secondary | ICD-10-CM | POA: Diagnosis not present

## 2023-08-19 DIAGNOSIS — Z961 Presence of intraocular lens: Secondary | ICD-10-CM | POA: Diagnosis not present

## 2023-08-22 DIAGNOSIS — E538 Deficiency of other specified B group vitamins: Secondary | ICD-10-CM | POA: Diagnosis not present

## 2023-08-22 DIAGNOSIS — Z23 Encounter for immunization: Secondary | ICD-10-CM | POA: Diagnosis not present

## 2023-09-02 DIAGNOSIS — H26492 Other secondary cataract, left eye: Secondary | ICD-10-CM | POA: Diagnosis not present

## 2023-09-16 DIAGNOSIS — Z9842 Cataract extraction status, left eye: Secondary | ICD-10-CM | POA: Diagnosis not present

## 2023-09-23 DIAGNOSIS — E538 Deficiency of other specified B group vitamins: Secondary | ICD-10-CM | POA: Diagnosis not present

## 2023-10-14 DIAGNOSIS — E1122 Type 2 diabetes mellitus with diabetic chronic kidney disease: Secondary | ICD-10-CM | POA: Diagnosis not present

## 2023-10-14 DIAGNOSIS — N1832 Chronic kidney disease, stage 3b: Secondary | ICD-10-CM | POA: Diagnosis not present

## 2023-10-14 DIAGNOSIS — I10 Essential (primary) hypertension: Secondary | ICD-10-CM | POA: Diagnosis not present

## 2023-10-14 DIAGNOSIS — D631 Anemia in chronic kidney disease: Secondary | ICD-10-CM | POA: Diagnosis not present

## 2023-10-14 DIAGNOSIS — N1831 Chronic kidney disease, stage 3a: Secondary | ICD-10-CM | POA: Diagnosis not present

## 2023-10-16 DIAGNOSIS — E7849 Other hyperlipidemia: Secondary | ICD-10-CM | POA: Diagnosis not present

## 2023-10-16 DIAGNOSIS — I10 Essential (primary) hypertension: Secondary | ICD-10-CM | POA: Diagnosis not present

## 2023-10-16 DIAGNOSIS — I341 Nonrheumatic mitral (valve) prolapse: Secondary | ICD-10-CM | POA: Diagnosis not present

## 2023-10-16 DIAGNOSIS — I4891 Unspecified atrial fibrillation: Secondary | ICD-10-CM | POA: Diagnosis not present

## 2023-10-23 DIAGNOSIS — J3089 Other allergic rhinitis: Secondary | ICD-10-CM | POA: Diagnosis not present

## 2023-10-23 DIAGNOSIS — E538 Deficiency of other specified B group vitamins: Secondary | ICD-10-CM | POA: Diagnosis not present

## 2023-10-23 DIAGNOSIS — J302 Other seasonal allergic rhinitis: Secondary | ICD-10-CM | POA: Diagnosis not present
